# Patient Record
Sex: Female | Born: 1965
Health system: Southern US, Community
[De-identification: ages and names within clinical notes are randomized; demographics above are authoritative.]

## PROBLEM LIST (undated history)

## (undated) DIAGNOSIS — F32A Depression, unspecified: Secondary | ICD-10-CM

## (undated) DIAGNOSIS — E039 Hypothyroidism, unspecified: Secondary | ICD-10-CM

## (undated) DIAGNOSIS — E079 Disorder of thyroid, unspecified: Secondary | ICD-10-CM

## (undated) DIAGNOSIS — I714 Abdominal aortic aneurysm, without rupture, unspecified: Secondary | ICD-10-CM

## (undated) DIAGNOSIS — G4733 Obstructive sleep apnea (adult) (pediatric): Secondary | ICD-10-CM

## (undated) DIAGNOSIS — R351 Nocturia: Secondary | ICD-10-CM

## (undated) DIAGNOSIS — R059 Cough, unspecified: Secondary | ICD-10-CM

## (undated) DIAGNOSIS — I10 Essential (primary) hypertension: Secondary | ICD-10-CM

## (undated) DIAGNOSIS — R05 Cough: Secondary | ICD-10-CM

## (undated) DIAGNOSIS — N200 Calculus of kidney: Secondary | ICD-10-CM

## (undated) DIAGNOSIS — G4730 Sleep apnea, unspecified: Secondary | ICD-10-CM

## (undated) DIAGNOSIS — F17218 Nicotine dependence, cigarettes, with other nicotine-induced disorders: Secondary | ICD-10-CM

## (undated) DIAGNOSIS — J189 Pneumonia, unspecified organism: Secondary | ICD-10-CM

## (undated) DIAGNOSIS — J353 Hypertrophy of tonsils with hypertrophy of adenoids: Secondary | ICD-10-CM

## (undated) DIAGNOSIS — F419 Anxiety disorder, unspecified: Secondary | ICD-10-CM

## (undated) DIAGNOSIS — Z9989 Dependence on other enabling machines and devices: Principal | ICD-10-CM

## (undated) DIAGNOSIS — F329 Major depressive disorder, single episode, unspecified: Secondary | ICD-10-CM

## (undated) DIAGNOSIS — C569 Malignant neoplasm of unspecified ovary: Secondary | ICD-10-CM

## (undated) HISTORY — DX: Dependence on other enabling machines and devices: Z99.89

## (undated) HISTORY — DX: Hypothyroidism, unspecified: E03.9

## (undated) HISTORY — PX: CHOLECYSTECTOMY: SHX55

## (undated) HISTORY — DX: Nocturia: R35.1

## (undated) HISTORY — DX: Nicotine dependence, cigarettes, with other nicotine-induced disorders: F17.218

## (undated) HISTORY — DX: Depression, unspecified: F32.A

## (undated) HISTORY — DX: Obstructive sleep apnea (adult) (pediatric): G47.33

## (undated) HISTORY — DX: Hypertrophy of tonsils with hypertrophy of adenoids: J35.3

## (undated) HISTORY — PX: TUBAL LIGATION: SHX77

## (undated) HISTORY — DX: Anxiety disorder, unspecified: F41.9

## (undated) HISTORY — DX: Major depressive disorder, single episode, unspecified: F32.9

## (undated) HISTORY — DX: Abdominal aortic aneurysm, without rupture, unspecified: I71.40

## (undated) HISTORY — DX: Abdominal aortic aneurysm, without rupture: I71.4

## (undated) HISTORY — DX: Sleep apnea, unspecified: G47.30

---

## 1998-06-19 ENCOUNTER — Inpatient Hospital Stay (HOSPITAL_COMMUNITY): Admission: AD | Admit: 1998-06-19 | Discharge: 1998-07-05 | Payer: Self-pay | Admitting: Internal Medicine

## 1998-06-20 ENCOUNTER — Encounter: Payer: Self-pay | Admitting: Pulmonary Disease

## 1998-06-21 ENCOUNTER — Encounter: Payer: Self-pay | Admitting: Pulmonary Disease

## 1998-06-22 ENCOUNTER — Encounter: Payer: Self-pay | Admitting: Pulmonary Disease

## 1998-06-23 ENCOUNTER — Encounter: Payer: Self-pay | Admitting: Pulmonary Disease

## 1998-06-27 ENCOUNTER — Encounter: Payer: Self-pay | Admitting: Critical Care Medicine

## 1998-06-28 ENCOUNTER — Encounter: Payer: Self-pay | Admitting: Critical Care Medicine

## 1998-06-29 ENCOUNTER — Encounter: Payer: Self-pay | Admitting: Critical Care Medicine

## 2000-11-06 ENCOUNTER — Emergency Department (HOSPITAL_COMMUNITY): Admission: EM | Admit: 2000-11-06 | Discharge: 2000-11-06 | Payer: Self-pay | Admitting: *Deleted

## 2000-11-06 ENCOUNTER — Encounter: Payer: Self-pay | Admitting: *Deleted

## 2001-01-14 HISTORY — PX: ABDOMINAL HYSTERECTOMY: SHX81

## 2001-07-07 ENCOUNTER — Inpatient Hospital Stay (HOSPITAL_COMMUNITY): Admission: RE | Admit: 2001-07-07 | Discharge: 2001-07-09 | Payer: Self-pay | Admitting: *Deleted

## 2001-07-15 ENCOUNTER — Encounter: Payer: Self-pay | Admitting: *Deleted

## 2001-07-15 ENCOUNTER — Ambulatory Visit (HOSPITAL_COMMUNITY): Admission: RE | Admit: 2001-07-15 | Discharge: 2001-07-15 | Payer: Self-pay | Admitting: *Deleted

## 2001-08-24 ENCOUNTER — Ambulatory Visit (HOSPITAL_COMMUNITY): Admission: RE | Admit: 2001-08-24 | Discharge: 2001-08-24 | Payer: Self-pay | Admitting: *Deleted

## 2001-09-18 ENCOUNTER — Encounter (HOSPITAL_COMMUNITY): Admission: RE | Admit: 2001-09-18 | Discharge: 2001-10-18 | Payer: Self-pay | Admitting: Oncology

## 2001-09-18 ENCOUNTER — Encounter: Admission: RE | Admit: 2001-09-18 | Discharge: 2001-09-18 | Payer: Self-pay | Admitting: Oncology

## 2001-09-23 ENCOUNTER — Ambulatory Visit (HOSPITAL_COMMUNITY): Admission: RE | Admit: 2001-09-23 | Discharge: 2001-09-23 | Payer: Self-pay | Admitting: General Surgery

## 2001-09-23 ENCOUNTER — Other Ambulatory Visit: Admission: RE | Admit: 2001-09-23 | Discharge: 2001-09-23 | Payer: Self-pay | Admitting: *Deleted

## 2001-09-23 ENCOUNTER — Encounter: Payer: Self-pay | Admitting: General Surgery

## 2001-10-19 ENCOUNTER — Encounter: Admission: RE | Admit: 2001-10-19 | Discharge: 2001-10-19 | Payer: Self-pay | Admitting: Oncology

## 2001-10-19 ENCOUNTER — Encounter (HOSPITAL_COMMUNITY): Admission: RE | Admit: 2001-10-19 | Discharge: 2001-11-18 | Payer: Self-pay | Admitting: Oncology

## 2001-11-30 ENCOUNTER — Encounter (HOSPITAL_COMMUNITY): Admission: RE | Admit: 2001-11-30 | Discharge: 2001-12-30 | Payer: Self-pay | Admitting: Oncology

## 2001-11-30 ENCOUNTER — Encounter: Admission: RE | Admit: 2001-11-30 | Discharge: 2001-11-30 | Payer: Self-pay | Admitting: Oncology

## 2001-12-25 ENCOUNTER — Encounter: Payer: Self-pay | Admitting: Internal Medicine

## 2001-12-25 ENCOUNTER — Emergency Department (HOSPITAL_COMMUNITY): Admission: EM | Admit: 2001-12-25 | Discharge: 2001-12-25 | Payer: Self-pay | Admitting: Internal Medicine

## 2002-01-11 ENCOUNTER — Encounter (HOSPITAL_COMMUNITY): Admission: RE | Admit: 2002-01-11 | Discharge: 2002-02-10 | Payer: Self-pay | Admitting: Oncology

## 2002-01-11 ENCOUNTER — Encounter: Admission: RE | Admit: 2002-01-11 | Discharge: 2002-01-11 | Payer: Self-pay | Admitting: Oncology

## 2002-01-28 ENCOUNTER — Encounter (HOSPITAL_COMMUNITY): Payer: Self-pay | Admitting: Oncology

## 2002-02-03 ENCOUNTER — Ambulatory Visit (HOSPITAL_COMMUNITY): Admission: RE | Admit: 2002-02-03 | Discharge: 2002-02-03 | Payer: Self-pay | Admitting: General Surgery

## 2002-05-03 ENCOUNTER — Encounter (HOSPITAL_COMMUNITY): Admission: RE | Admit: 2002-05-03 | Discharge: 2002-06-02 | Payer: Self-pay | Admitting: Oncology

## 2002-05-03 ENCOUNTER — Encounter: Admission: RE | Admit: 2002-05-03 | Discharge: 2002-05-03 | Payer: Self-pay

## 2002-05-26 ENCOUNTER — Inpatient Hospital Stay (HOSPITAL_COMMUNITY): Admission: EM | Admit: 2002-05-26 | Discharge: 2002-05-28 | Payer: Self-pay | Admitting: Internal Medicine

## 2002-05-26 ENCOUNTER — Encounter: Payer: Self-pay | Admitting: Internal Medicine

## 2002-05-27 ENCOUNTER — Encounter: Payer: Self-pay | Admitting: Urology

## 2002-08-02 ENCOUNTER — Encounter (HOSPITAL_COMMUNITY): Admission: RE | Admit: 2002-08-02 | Discharge: 2002-09-01 | Payer: Self-pay | Admitting: Oncology

## 2002-08-02 ENCOUNTER — Encounter: Admission: RE | Admit: 2002-08-02 | Discharge: 2002-08-02 | Payer: Self-pay | Admitting: Oncology

## 2002-11-02 ENCOUNTER — Encounter: Admission: RE | Admit: 2002-11-02 | Discharge: 2002-11-02 | Payer: Self-pay | Admitting: Oncology

## 2002-11-02 ENCOUNTER — Encounter (HOSPITAL_COMMUNITY): Admission: RE | Admit: 2002-11-02 | Discharge: 2002-12-02 | Payer: Self-pay | Admitting: Oncology

## 2002-11-27 ENCOUNTER — Emergency Department (HOSPITAL_COMMUNITY): Admission: EM | Admit: 2002-11-27 | Discharge: 2002-11-28 | Payer: Self-pay

## 2003-02-02 ENCOUNTER — Encounter: Admission: RE | Admit: 2003-02-02 | Discharge: 2003-02-02 | Payer: Self-pay | Admitting: Oncology

## 2003-02-02 ENCOUNTER — Encounter (HOSPITAL_COMMUNITY): Admission: RE | Admit: 2003-02-02 | Discharge: 2003-03-04 | Payer: Self-pay | Admitting: Oncology

## 2005-01-14 HISTORY — PX: HEMORRHOID SURGERY: SHX153

## 2006-03-21 ENCOUNTER — Observation Stay (HOSPITAL_COMMUNITY): Admission: RE | Admit: 2006-03-21 | Discharge: 2006-03-22 | Payer: Self-pay | Admitting: General Surgery

## 2010-02-27 LAB — HM MAMMOGRAPHY: HM Mammogram: NORMAL

## 2010-07-30 ENCOUNTER — Emergency Department (HOSPITAL_COMMUNITY)
Admission: EM | Admit: 2010-07-30 | Discharge: 2010-07-30 | Discharge status: Against Medical Advice | Payer: PRIVATE HEALTH INSURANCE | Attending: Emergency Medicine | Admitting: Emergency Medicine

## 2010-07-30 ENCOUNTER — Encounter: Payer: Self-pay | Admitting: Emergency Medicine

## 2010-07-30 DIAGNOSIS — R350 Frequency of micturition: Secondary | ICD-10-CM | POA: Insufficient documentation

## 2010-07-30 DIAGNOSIS — R509 Fever, unspecified: Secondary | ICD-10-CM | POA: Insufficient documentation

## 2010-07-30 DIAGNOSIS — R52 Pain, unspecified: Secondary | ICD-10-CM | POA: Insufficient documentation

## 2010-07-30 HISTORY — DX: Pneumonia, unspecified organism: J18.9

## 2010-07-30 HISTORY — DX: Essential (primary) hypertension: I10

## 2010-07-30 HISTORY — DX: Malignant neoplasm of unspecified ovary: C56.9

## 2010-07-30 HISTORY — DX: Disorder of thyroid, unspecified: E07.9

## 2010-07-30 NOTE — ED Notes (Signed)
Pt states symptoms began at 1400. Fever, chills, body aches, and urinary frequency.

## 2012-08-31 ENCOUNTER — Other Ambulatory Visit: Payer: Self-pay | Admitting: Family Medicine

## 2012-08-31 ENCOUNTER — Encounter: Payer: Self-pay | Admitting: Family Medicine

## 2012-08-31 NOTE — Telephone Encounter (Signed)
This encounter was created in error - please disregard.

## 2012-08-31 NOTE — Telephone Encounter (Signed)
Levothyroxine 112 mcg tab 1 QD #30

## 2012-10-05 ENCOUNTER — Telehealth: Payer: Self-pay | Admitting: Family Medicine

## 2012-10-05 ENCOUNTER — Other Ambulatory Visit: Payer: Self-pay | Admitting: Family Medicine

## 2012-10-05 ENCOUNTER — Encounter: Payer: Self-pay | Admitting: Family Medicine

## 2012-10-05 NOTE — Telephone Encounter (Signed)
Celexa 40 mg 1 QD #30

## 2012-10-05 NOTE — Telephone Encounter (Signed)
Medication refill for one time only.  Patient needs to be seen.  Letter sent for patient to call and schedule 

## 2012-10-06 MED ORDER — CITALOPRAM HYDROBROMIDE 40 MG PO TABS: ORAL_TABLET | ORAL | Status: DC

## 2012-11-03 ENCOUNTER — Ambulatory Visit (INDEPENDENT_AMBULATORY_CARE_PROVIDER_SITE_OTHER): Payer: PRIVATE HEALTH INSURANCE | Admitting: Family Medicine

## 2012-11-03 ENCOUNTER — Encounter: Payer: Self-pay | Admitting: Family Medicine

## 2012-11-03 VITALS — BP 126/76 | HR 64 | Temp 98.5°F | Resp 18 | Ht 64.0 in | Wt 232.0 lb

## 2012-11-03 DIAGNOSIS — F411 Generalized anxiety disorder: Secondary | ICD-10-CM

## 2012-11-03 DIAGNOSIS — Z23 Encounter for immunization: Secondary | ICD-10-CM

## 2012-11-03 DIAGNOSIS — E039 Hypothyroidism, unspecified: Secondary | ICD-10-CM

## 2012-11-03 LAB — CBC WITH DIFFERENTIAL/PLATELET
Basophils Absolute: 0 10*3/uL (ref 0.0–0.1)
Eosinophils Absolute: 0.2 10*3/uL (ref 0.0–0.7)
Eosinophils Relative: 2 % (ref 0–5)
Hemoglobin: 13.1 g/dL (ref 12.0–15.0)
Lymphocytes Relative: 55 % — ABNORMAL HIGH (ref 12–46)
Lymphs Abs: 3.5 10*3/uL (ref 0.7–4.0)
MCH: 29.5 pg (ref 26.0–34.0)
MCV: 87.2 fL (ref 78.0–100.0)
Monocytes Relative: 7 % (ref 3–12)
Platelets: 257 10*3/uL (ref 150–400)
RBC: 4.44 MIL/uL (ref 3.87–5.11)
RDW: 15.4 % (ref 11.5–15.5)
WBC: 6.4 10*3/uL (ref 4.0–10.5)

## 2012-11-03 MED ORDER — ALPRAZOLAM 0.5 MG PO TABS: 0.5000 mg | ORAL_TABLET | Freq: Three times a day (TID) | ORAL | Status: DC | PRN

## 2012-11-03 MED ORDER — LEVOTHYROXINE SODIUM 112 MCG PO TABS: 112.0000 ug | ORAL_TABLET | Freq: Every day | ORAL | Status: DC

## 2012-11-03 MED ORDER — CITALOPRAM HYDROBROMIDE 40 MG PO TABS: ORAL_TABLET | ORAL | Status: DC

## 2012-11-03 NOTE — Progress Notes (Signed)
Subjective:    Patient ID: [name] [name], female    DOB: Jul 09, 1965, 47 y.o.   MRN: [id number]  HPI Patient 47 year old white female with a history of hypothyroidism. She's currently taking levothyroxine 112 mcg by mouth daily. She is due to recheck a TSH. She is trying to exercise more. She is riding a bicycle at home. She is switched to diet sodas. She is trying to watch her caloric intake. She continues to smoke. She reports worsening anxiety. Celexa 40 mg by mouth daily seems to help her depression very well. Unfortunately she is having breakthrough anxiety attacks as well as crying spells 2 to stress she is under. 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 The mounting family stress is beginning to cause the patient more emotional problems. She would like to have a medication she could use sparingly as needed for anxiety. Past Medical History  Diagnosis Date  . Thyroid disease   . Hypertension   . Pneumonia   . Ovarian cancer   . Anxiety   . Depression    No current outpatient prescriptions on file prior to visit.   No current facility-administered medications on file prior to visit.   Allergies  Allergen Reactions  . Other     IV CONTRAST   History   Social History  . Marital Status: Married    Spouse Name: N/A    Number of Children: N/A  . Years of Education: N/A   Occupational History  . Not on file.   Social History Main Topics  . Smoking status: Current Every Day Smoker -- 1.00 packs/day    Types: Cigarettes  . Smokeless tobacco: Never Used  . Alcohol Use: No  . Drug Use: No  . Sexual Activity: Not on file   Other Topics Concern  . Not on file   Social History Narrative  . No narrative on file      Review of Systems  All other systems reviewed and are negative.       Objective:   Physical Exam  Vitals reviewed. Neck: Neck supple. No JVD present. No thyromegaly present.  Cardiovascular: Normal rate, regular rhythm and normal heart  sounds.  Exam reveals no gallop.   No murmur heard. Pulmonary/Chest: Effort normal and breath sounds normal. No respiratory distress. She has no wheezes. She has no rales. She exhibits no tenderness.  Abdominal: Soft. Bowel sounds are normal. She exhibits no distension. There is no tenderness. There is no rebound and no guarding.  Lymphadenopathy:    She has no cervical adenopathy.  Psychiatric: She has a normal mood and affect. Her behavior is normal. Judgment and thought content normal.          Assessment & Plan:  Unspecified hypothyroidism - Plan: TSH, CBC with Differential, COMPLETE METABOLIC PANEL WITH GFR  GAD (generalized anxiety disorder) - Plan: ALPRAZolam (XANAX) 0.5 MG tablet  Need for pneumococcal vaccine - Plan: Pneumococcal polysaccharide vaccine 23-valent greater than or equal to 2yo subcutaneous/IM  I recommended the patient discontinue smoking. I did give her a pneumonia vaccine. She has had a flu shot. Check a CBC a CMP and TSH to evaluate her hyperthyroidism. Her blood pressures well controlled. I recommended increasing aerobic exercise and decreasing daily caloric intake to help facilitate further weight loss.  Continue Celexa 40 mg by mouth daily. Add Xanax 0.5 mg Q8 hours as needed for anxiety. I gave the patient 60 tablets with 0 refills. I instructed her to use this  sparingly. If symptoms worsen I would switch the patient to Effexor from the Celexa.

## 2012-11-04 LAB — COMPLETE METABOLIC PANEL WITH GFR
ALT: 16 U/L (ref 0–35)
CO2: 26 mEq/L (ref 19–32)
Calcium: 9.5 mg/dL (ref 8.4–10.5)
Chloride: 105 mEq/L (ref 96–112)
Creat: 0.64 mg/dL (ref 0.50–1.10)
GFR, Est African American: >89 mL/min
GFR, Est Non African American: >89 mL/min
Glucose, Bld: 76 mg/dL (ref 70–99)
Sodium: 137 mEq/L (ref 135–145)
Total Protein: 7.3 g/dL (ref 6.0–8.3)

## 2012-11-05 ENCOUNTER — Encounter: Payer: Self-pay | Admitting: Family Medicine

## 2012-11-24 ENCOUNTER — Encounter (HOSPITAL_COMMUNITY): Payer: Self-pay | Admitting: Emergency Medicine

## 2012-11-24 ENCOUNTER — Emergency Department (HOSPITAL_COMMUNITY): Payer: PRIVATE HEALTH INSURANCE

## 2012-11-24 ENCOUNTER — Emergency Department (HOSPITAL_COMMUNITY)
Admission: EM | Admit: 2012-11-24 | Discharge: 2012-11-24 | Disposition: A | Discharge status: Home / Self Care | Payer: PRIVATE HEALTH INSURANCE | Attending: Emergency Medicine | Admitting: Emergency Medicine

## 2012-11-24 DIAGNOSIS — F329 Major depressive disorder, single episode, unspecified: Secondary | ICD-10-CM | POA: Insufficient documentation

## 2012-11-24 DIAGNOSIS — M542 Cervicalgia: Secondary | ICD-10-CM | POA: Insufficient documentation

## 2012-11-24 DIAGNOSIS — R42 Dizziness and giddiness: Secondary | ICD-10-CM | POA: Insufficient documentation

## 2012-11-24 DIAGNOSIS — E079 Disorder of thyroid, unspecified: Secondary | ICD-10-CM | POA: Insufficient documentation

## 2012-11-24 DIAGNOSIS — Z8543 Personal history of malignant neoplasm of ovary: Secondary | ICD-10-CM | POA: Insufficient documentation

## 2012-11-24 DIAGNOSIS — F411 Generalized anxiety disorder: Secondary | ICD-10-CM | POA: Insufficient documentation

## 2012-11-24 DIAGNOSIS — Z87442 Personal history of urinary calculi: Secondary | ICD-10-CM | POA: Insufficient documentation

## 2012-11-24 DIAGNOSIS — Z79899 Other long term (current) drug therapy: Secondary | ICD-10-CM | POA: Insufficient documentation

## 2012-11-24 DIAGNOSIS — M62838 Other muscle spasm: Secondary | ICD-10-CM

## 2012-11-24 DIAGNOSIS — F3289 Other specified depressive episodes: Secondary | ICD-10-CM | POA: Insufficient documentation

## 2012-11-24 DIAGNOSIS — I10 Essential (primary) hypertension: Secondary | ICD-10-CM | POA: Insufficient documentation

## 2012-11-24 DIAGNOSIS — Z8701 Personal history of pneumonia (recurrent): Secondary | ICD-10-CM | POA: Insufficient documentation

## 2012-11-24 DIAGNOSIS — F17200 Nicotine dependence, unspecified, uncomplicated: Secondary | ICD-10-CM | POA: Insufficient documentation

## 2012-11-24 HISTORY — DX: Calculus of kidney: N20.0

## 2012-11-24 LAB — URINALYSIS, ROUTINE W REFLEX MICROSCOPIC
Bilirubin Urine: NEGATIVE
Ketones, ur: NEGATIVE mg/dL
Leukocytes, UA: NEGATIVE
Nitrite: NEGATIVE
Protein, ur: NEGATIVE mg/dL
Specific Gravity, Urine: 1.025 (ref 1.005–1.030)
Urobilinogen, UA: 0.2 mg/dL (ref 0.0–1.0)
pH: 6 (ref 5.0–8.0)

## 2012-11-24 LAB — POCT I-STAT, CHEM 8
Calcium, Ion: 1.31 mmol/L — ABNORMAL HIGH (ref 1.12–1.23)
Creatinine, Ser: 0.8 mg/dL (ref 0.50–1.10)
Hemoglobin: 12.9 g/dL (ref 12.0–15.0)
Sodium: 144 mEq/L (ref 135–145)
TCO2: 25 mmol/L (ref 0–100)

## 2012-11-24 LAB — POCT I-STAT TROPONIN I

## 2012-11-24 MED ORDER — METHOCARBAMOL 500 MG PO TABS: 1000.0000 mg | ORAL_TABLET | Freq: Four times a day (QID) | ORAL | Status: DC | PRN

## 2012-11-24 MED ORDER — NAPROXEN 250 MG PO TABS: 250.0000 mg | ORAL_TABLET | Freq: Two times a day (BID) | ORAL | Status: DC

## 2012-11-24 MED ORDER — HYDROCODONE-ACETAMINOPHEN 5-325 MG PO TABS
1.0000 | ORAL_TABLET | Freq: Once | ORAL | Status: AC
  Administered 2012-11-24: 1 via ORAL
  Filled 2012-11-24: qty 1

## 2012-11-24 MED ORDER — HYDROCODONE-ACETAMINOPHEN 5-325 MG PO TABS: ORAL_TABLET | ORAL | Status: DC

## 2012-11-24 MED ORDER — DIAZEPAM 5 MG PO TABS
5.0000 mg | ORAL_TABLET | Freq: Once | ORAL | Status: AC
  Administered 2012-11-24: 5 mg via ORAL
  Filled 2012-11-24: qty 1

## 2012-11-24 NOTE — ED Notes (Signed)
Patient complaining of left shoulder pain radiating into neck. Reports has felt lightheaded since yesterday.

## 2012-11-24 NOTE — ED Provider Notes (Signed)
I assumed care at signout Pt feels improved She is ambulatory EKG/labs reviewed Stable for d/c home  Joya Gaskins, MD 11/24/12 2332

## 2012-11-24 NOTE — ED Notes (Signed)
Pt presents with left neck and shoulder pain that she awoke with this morning.  Pt also c/o headache x 2 weeks. Spouse reports pt has stopped drinking caffeine 2 weeks ago abruptly. Small knot felt along her collar bone on left side.  Reports unable to lift left arm above her head. Pt denies injury/trauma, and no swelling or deformity noted. Radial pulses equal at this time.

## 2012-11-24 NOTE — ED Provider Notes (Signed)
CSN: [id number]     Arrival date & time 11/24/12  1834 History   First MD Initiated Contact with Patient 11/24/12 2021     Chief Complaint  Patient presents with  . Shoulder Pain  . Dizziness    HPI Pt was seen at 2025. Per pt, c/o gradual onset and persistence of constant left sided neck/shoulder pain for the past 2 days. Pt describes the pain as "sharp," worsens when she moves her left shoulder into abduction. 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 Pt also states she has felt "lightheaded" since yesterday. Her lightheadedness occurs when she moves from bending over to standing positions. Denies CP/palpitations, no SOB/cough, no abd pain, no N/V/D, no injury, no headache, no visual changes, no focal motor weakness, no tingling/numbness in extremities, no fevers, no rash.        Past Medical History  Diagnosis Date  . Thyroid disease   . Hypertension   . Pneumonia   . Ovarian cancer   . Anxiety   . Depression   . Kidney stone    Past Surgical History  Procedure Laterality Date  . Abdominal hysterectomy    . Tubal ligation    . Hemorrhoid surgery      History  Substance Use Topics  . Smoking status: Current Every Day Smoker -- 1.00 packs/day    Types: Cigarettes  . Smokeless tobacco: Never Used  . Alcohol Use: No    Review of Systems ROS: Statement: All systems negative except as marked or noted in the HPI; Constitutional: Negative for fever and chills. ; ; Eyes: Negative for eye pain, redness and discharge. ; ; ENMT: Negative for ear pain, hoarseness, nasal congestion, sinus pressure and sore throat. ; ; Cardiovascular: Negative for chest pain, palpitations, diaphoresis, dyspnea and peripheral edema. ; ; Respiratory: Negative for cough, wheezing and stridor. ; ; Gastrointestinal: Negative for nausea, vomiting, diarrhea, abdominal pain, blood in stool, hematemesis, jaundice and rectal bleeding. . ; ; Genitourinary: Negative for dysuria, flank pain and hematuria. ; ;  Musculoskeletal: +left neck/shoulder pain. Negative for back pain. Negative for swelling and trauma.; ; Skin: Negative for pruritus, rash, abrasions, blisters, bruising and skin lesion.; ; Neuro: +lightheadedness. Negative for headache and neck stiffness. Negative for weakness, altered level of consciousness , altered mental status, extremity weakness, paresthesias, involuntary movement, seizure and syncope.     Allergies  Other  Home Medications   Current Outpatient Rx  Name  Route  Sig  Dispense  Refill  . ALPRAZolam (XANAX) 0.5 MG tablet   Oral   Take 1 tablet (0.5 mg total) by mouth 3 (three) times daily as needed for sleep or anxiety.   60 tablet   0   . citalopram (CELEXA) 40 MG tablet   Oral   Take 40 mg by mouth daily.         Marland Kitchen levothyroxine (SYNTHROID, LEVOTHROID) 112 MCG tablet   Oral   Take 1 tablet (112 mcg total) by mouth daily before breakfast.   30 tablet   3   . Multiple Vitamin (MULTIVITAMIN) tablet   Oral   Take 1 tablet by mouth daily.         . Omega-3 Fatty Acids (FISH OIL) 1000 MG CAPS   Oral   Take 2 capsules by mouth daily.          BP 126/75  Pulse 87  Temp(Src) 98.4 F (36.9 C) (Oral)  Resp 20  Ht 5\' 3"  (1.6 m)  Wt 224 lb (101.606 kg)  BMI 39.69 kg/m2  SpO2 98% Physical Exam 2030: Physical examination:  Nursing notes reviewed; Vital signs and O2 SAT reviewed;  Constitutional: Well developed, Well nourished, Well hydrated, In no acute distress; Head:  Normocephalic, atraumatic; Eyes: EOMI, PERRL, No scleral icterus; ENMT: Mouth and pharynx normal, Mucous membranes moist; Neck: Supple, no meningeal signs. Full range of motion, No lymphadenopathy; Cardiovascular: Regular rate and rhythm, No gallop; Respiratory: Breath sounds clear & equal bilaterally, No wheezes.  Speaking full sentences with ease, Normal respiratory effort/excursion; Chest: Nontender, Movement normal; Abdomen: Soft, Nontender, Nondistended, Normal bowel sounds;  Genitourinary: No CVA tenderness; Spine:  No midline CS, TS, LS tenderness. +TTP left hypertonic trapezius muscle which reproduces pt's pain.;; Extremities: Pulses normal, NT left shoulder/elbow/wrist. NMS intact LUE with strong radial pulse. No rash, no deformity. No edema, No calf edema or asymmetry.; Neuro: AA&Ox3, Major CN grossly intact. No facial droop. Speech clear. Climbs on and off stretcher easily by herself. Gait steady. No gross focal motor or sensory deficits in extremities.; Skin: Color normal, Warm, Dry.   ED Course  Procedures  EKG Interpretation     Ventricular Rate:  77 PR Interval:  172 QRS Duration: 108 QT Interval:  396 QTC Calculation: 448 R Axis:   112 Text Interpretation:  Normal sinus rhythm Low voltage QRS Incomplete right bundle branch block Left posterior fascicular block No previous ECGs available            MDM  MDM Reviewed: previous chart, nursing note and vitals Reviewed previous: labs Interpretation: labs, ECG and x-ray   Results for orders placed during the hospital encounter of 11/24/12  POCT I-STAT TROPONIN I      Result Value Range   Troponin i, poc 0.00  0.00 - 0.08 ng/mL   Comment 3           POCT I-STAT, CHEM 8      Result Value Range   Sodium 144  135 - 145 mEq/L   Potassium 4.0  3.5 - 5.1 mEq/L   Chloride 106  96 - 112 mEq/L   BUN 23  6 - 23 mg/dL   Creatinine, Ser 4.09  0.50 - 1.10 mg/dL   Glucose, Bld 811 (*) 70 - 99 mg/dL   Calcium, Ion 9.14 (*) 1.12 - 1.23 mmol/L   TCO2 25  0 - 100 mmol/L   Hemoglobin 12.9  12.0 - 15.0 g/dL   HCT 78.2  95.6 - 21.3 %   Dg Chest 2 View 11/24/2012   CLINICAL DATA:  Shoulder pain and dizziness.  EXAM: CHEST  2 VIEW  COMPARISON:  None.  FINDINGS: Lungs are adequately inflated without focal consolidation or effusion. There is mild cardiomegaly. There are prominent overlying soft tissues. There is minimal spondylosis of the spine.  IMPRESSION: No acute cardiopulmonary disease.  Mild  cardiomegaly.   Electronically Signed   By: Elberta Fortis M.D.   On: 11/24/2012 21:38    2245:  Pt is not orthostatic. Neuro exam intact and unchanged. Pt is awaiting pain meds and to give a urine sample. Sign out to Dr. Bebe Shaggy.     Laray Anger, DO 11/24/12 [phone]

## 2012-11-24 NOTE — ED Notes (Signed)
MD at bedside. Dr Alwyn Pea at bedside, discussing plan of care and examining said pt.

## 2013-03-23 ENCOUNTER — Encounter (HOSPITAL_COMMUNITY): Payer: Self-pay | Admitting: Emergency Medicine

## 2013-03-23 ENCOUNTER — Emergency Department (HOSPITAL_COMMUNITY)
Admission: EM | Admit: 2013-03-23 | Discharge: 2013-03-24 | Disposition: A | Discharge status: Home / Self Care | Payer: PRIVATE HEALTH INSURANCE | Attending: Emergency Medicine | Admitting: Emergency Medicine

## 2013-03-23 ENCOUNTER — Emergency Department (HOSPITAL_COMMUNITY): Payer: PRIVATE HEALTH INSURANCE

## 2013-03-23 DIAGNOSIS — Z8543 Personal history of malignant neoplasm of ovary: Secondary | ICD-10-CM | POA: Insufficient documentation

## 2013-03-23 DIAGNOSIS — F329 Major depressive disorder, single episode, unspecified: Secondary | ICD-10-CM | POA: Insufficient documentation

## 2013-03-23 DIAGNOSIS — F411 Generalized anxiety disorder: Secondary | ICD-10-CM | POA: Insufficient documentation

## 2013-03-23 DIAGNOSIS — X58XXXA Exposure to other specified factors, initial encounter: Secondary | ICD-10-CM | POA: Insufficient documentation

## 2013-03-23 DIAGNOSIS — Z8701 Personal history of pneumonia (recurrent): Secondary | ICD-10-CM | POA: Insufficient documentation

## 2013-03-23 DIAGNOSIS — Z87442 Personal history of urinary calculi: Secondary | ICD-10-CM | POA: Insufficient documentation

## 2013-03-23 DIAGNOSIS — IMO0002 Reserved for concepts with insufficient information to code with codable children: Secondary | ICD-10-CM | POA: Insufficient documentation

## 2013-03-23 DIAGNOSIS — Y939 Activity, unspecified: Secondary | ICD-10-CM | POA: Insufficient documentation

## 2013-03-23 DIAGNOSIS — R509 Fever, unspecified: Secondary | ICD-10-CM

## 2013-03-23 DIAGNOSIS — F17200 Nicotine dependence, unspecified, uncomplicated: Secondary | ICD-10-CM | POA: Insufficient documentation

## 2013-03-23 DIAGNOSIS — Y929 Unspecified place or not applicable: Secondary | ICD-10-CM | POA: Insufficient documentation

## 2013-03-23 DIAGNOSIS — F3289 Other specified depressive episodes: Secondary | ICD-10-CM | POA: Insufficient documentation

## 2013-03-23 DIAGNOSIS — E079 Disorder of thyroid, unspecified: Secondary | ICD-10-CM | POA: Insufficient documentation

## 2013-03-23 DIAGNOSIS — R112 Nausea with vomiting, unspecified: Secondary | ICD-10-CM

## 2013-03-23 DIAGNOSIS — R197 Diarrhea, unspecified: Secondary | ICD-10-CM | POA: Insufficient documentation

## 2013-03-23 DIAGNOSIS — I10 Essential (primary) hypertension: Secondary | ICD-10-CM | POA: Insufficient documentation

## 2013-03-23 DIAGNOSIS — Z79899 Other long term (current) drug therapy: Secondary | ICD-10-CM | POA: Insufficient documentation

## 2013-03-23 LAB — CBC WITH DIFFERENTIAL/PLATELET
Basophils Absolute: 0 10*3/uL (ref 0.0–0.1)
Basophils Relative: 0 % (ref 0–1)
EOS PCT: 0 % (ref 0–5)
Eosinophils Absolute: 0 10*3/uL (ref 0.0–0.7)
HEMATOCRIT: 39.9 % (ref 36.0–46.0)
HEMOGLOBIN: 13.4 g/dL (ref 12.0–15.0)
LYMPHS PCT: 10 % — AB (ref 12–46)
Lymphs Abs: 0.7 10*3/uL (ref 0.7–4.0)
MCH: 30 pg (ref 26.0–34.0)
MCHC: 33.6 g/dL (ref 30.0–36.0)
MCV: 89.3 fL (ref 78.0–100.0)
MONO ABS: 0.2 10*3/uL (ref 0.1–1.0)
MONOS PCT: 3 % (ref 3–12)
Neutro Abs: 6 10*3/uL (ref 1.7–7.7)
Neutrophils Relative %: 87 % — ABNORMAL HIGH (ref 43–77)
Platelets: 208 10*3/uL (ref 150–400)
RBC: 4.47 MIL/uL (ref 3.87–5.11)
RDW: 15.4 % (ref 11.5–15.5)
WBC: 6.9 10*3/uL (ref 4.0–10.5)

## 2013-03-23 MED ORDER — IBUPROFEN 800 MG PO TABS
800.0000 mg | ORAL_TABLET | Freq: Once | ORAL | Status: AC
  Administered 2013-03-23: 800 mg via ORAL
  Filled 2013-03-23: qty 1

## 2013-03-23 MED ORDER — SODIUM CHLORIDE 0.9 % IV SOLN: 1000.0000 mL | INTRAVENOUS | Status: DC

## 2013-03-23 MED ORDER — SODIUM CHLORIDE 0.9 % IV SOLN
1000.0000 mL | Freq: Once | INTRAVENOUS | Status: AC
  Administered 2013-03-23: 1000 mL via INTRAVENOUS

## 2013-03-23 NOTE — ED Notes (Signed)
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 Pt received 250cc of fluid and zofran 4mg  enroute to facility.

## 2013-03-23 NOTE — ED Provider Notes (Signed)
CSN: [id number]     Arrival date & time 03/23/13  2215 History  This chart was scribed for Hoy Morn, MD by Jenne Campus, ED Scribe. This patient was seen in room APA10/APA10 and the patient's care was started at 11:13 PM.   Chief Complaint  Patient presents with  . Loss of Consciousness     The history is provided by the patient. No language interpreter was used.    HPI Comments: [name] [name] is a 48 y.o. female who presents to the Emergency Department via EMS from home with husband and family friend for one episode of syncope that occurred about one hour ago.  Pt states that she has had sinus congestion, mild cough and a HA for the past week. She then developed nausea and vomiting after eating today. Pt had been laying in bed with her CPAP machine when she developed chills. She had taken it off and went to get out of bed when "everything went white".  Husband states that she got up out of bed and a few seconds later he heard a loud bang. He found her face down on the floor with blood coming out of her nose. The episode lasted for 30 seconds after which he called EMS. He states that while waiting for EMS he cleaned the patient up of a loose BM. He denies noting any jerking movements. He denies any prior episodes of syncope to his knowledge. Pt denies any nausea currently. She denies any abdominal pain, diarrhea, neck or back pain, SOB and urinary symptoms. She denies any changes in use of her extremities. She takes one [facility] power everyday but denies being on any anticoagulants.   Works at [employer]. 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 Thyroid disease   . Hypertension   . Pneumonia   . Ovarian cancer   . Anxiety   . Depression   . Kidney stone    Past Surgical History  Procedure Laterality Date  . Abdominal hysterectomy    . Tubal ligation    . Hemorrhoid surgery     History reviewed. No pertinent family  history. History  Substance Use Topics  . Smoking status: Current Every Day Smoker -- 1.00 packs/day    Types: Cigarettes  . Smokeless tobacco: Never Used  . Alcohol Use: No   No OB history provided.   Review of Systems  A complete 10 system review of systems was obtained and all systems are negative except as noted in the HPI and PMH.   Allergies  Other  Home Medications   Current Outpatient Rx  Name  Route  Sig  Dispense  Refill  . citalopram (CELEXA) 40 MG tablet   Oral   Take 40 mg by mouth daily.         Marland Kitchen HYDROcodone-acetaminophen (NORCO/VICODIN) 5-325 MG per tablet   Oral   Take 1 tablet by mouth every 6 (six) hours as needed. 1 or 2 tabs PO q6 hours prn pain         . levothyroxine (SYNTHROID, LEVOTHROID) 112 MCG tablet   Oral   Take 1 tablet (112 mcg total) by mouth daily before breakfast.   30 tablet   3   . Multiple Vitamin (MULTIVITAMIN) tablet   Oral   Take 1 tablet by mouth daily.         . Omega-3 Fatty Acids (FISH OIL) 1000 MG CAPS  Oral   Take 2 capsules by mouth daily.         . ondansetron (ZOFRAN ODT) 8 MG disintegrating tablet   Oral   Take 1 tablet (8 mg total) by mouth every 8 (eight) hours as needed for nausea or vomiting.   10 tablet   0    Triage Vitals: BP 115/68  Pulse 84  Temp(Src) 99.8 F (37.7 C) (Oral)  Resp 22  Ht 5\' 3"  (1.6 m)  Wt 232 lb (105.235 kg)  BMI 41.11 kg/m2  SpO2 96%  Physical Exam  Nursing note and vitals reviewed. Constitutional: She is oriented to person, place, and time. She appears well-developed and well-nourished. No distress.  Oral temp is 100.4   HENT:  Head: Normocephalic and atraumatic.  No trismus, no malocclusion, no facial bruising or swelling  Eyes: EOM are normal.  Neck: Normal range of motion.  Cardiovascular: Normal rate, regular rhythm and normal heart sounds.   Pulmonary/Chest: Effort normal and breath sounds normal.  Abdominal: Soft. She exhibits no distension. There is no  tenderness.  Musculoskeletal: Normal range of motion.  Abrasion to the left knee. No significant swelling. FROM of left knee. No effusion. No joint tenderness.   Neurological: She is alert and oriented to person, place, and time.  Good strength in BUE  Skin: Skin is warm and dry.  Psychiatric: She has a normal mood and affect. Judgment normal.    ED Course  Procedures (including critical care time)  Medications  0.9 %  sodium chloride infusion (0 mLs Intravenous Stopped 03/24/13 0026)    Followed by  0.9 %  sodium chloride infusion (not administered)  ibuprofen (ADVIL,MOTRIN) tablet 800 mg (800 mg Oral Given 03/23/13 2326)  acetaminophen (TYLENOL) tablet 1,000 mg (1,000 mg Oral Given 03/24/13 0030)    DIAGNOSTIC STUDIES: Oxygen Saturation is 96% on RA, adequate by my interpretation.    COORDINATION OF CARE: 11:22 PM-Discussed treatment plan which includes Motrin for fever, CXR, CBC, BMP and troponin with pt at bedside and pt agreed to plan.    Labs Review Labs Reviewed  CBC WITH DIFFERENTIAL - Abnormal; Notable for the following:    Neutrophils Relative % 87 (*)    Lymphocytes Relative 10 (*)    All other components within normal limits  BASIC METABOLIC PANEL - Abnormal; Notable for the following:    Glucose, Bld 110 (*)    All other components within normal limits  TROPONIN I   Imaging Review Dg Chest 2 View  03/24/2013   CLINICAL DATA Nausea, weakness, hypertension, ovarian cancer, smoker.  EXAM CHEST  2 VIEW  COMPARISON 11/24/2012  FINDINGS Heart size upper normal to mildly enlarged, similar prior. Mild aortic tortuosity is similar to prior. Mild interstitial prominence is similar to prior. No confluent airspace opacity, pleural effusion, or pneumothorax. Mild multilevel degenerative changes. No acute osseous finding.  IMPRESSION Heart size upper normal to mildly enlarged. Mild chronic increased interstitial markings. No acute process identified.  SIGNATURE  Electronically  Signed   By: Carlos Levering M.D.   On: 03/24/2013 00:27     EKG Interpretation   Date/Time:  Tuesday March 23 2013 23:31:51 EDT Ventricular Rate:  84 PR Interval:  158 QRS Duration: 102 QT Interval:  370 QTC Calculation: 437 R Axis:   126 Text Interpretation:  Normal sinus rhythm Incomplete right bundle branch  block When compared with ECG of 24-Nov-2012 19:18, No significant change  was found Confirmed by Wilson Singer  MD, STEPHEN ([id number]) on  03/23/2013 11:37:35 PM      MDM   Final diagnoses:  Fever  Nausea vomiting and diarrhea   1:22 AM Pt feels better. Ambulatory in ER. Fever. Likely viral. Repeat abdominal exam benign. Vitals stable. Dc home with zofran. She understands to return to ER for new or worsening symptoms  I personally performed the services described in this documentation, which was scribed in my presence. The recorded information has been reviewed and is accurate.       Hoy Morn, MD 03/24/13 [phone]

## 2013-03-23 NOTE — ED Notes (Signed)
Pt c/o vomiting twice today with a brief syncopal episode on the way to the bathroom.

## 2013-03-24 LAB — TROPONIN I: Troponin I: <0.3 ng/mL (ref <0.30)

## 2013-03-24 LAB — BASIC METABOLIC PANEL
BUN: 19 mg/dL (ref 6–23)
CO2: 27 meq/L (ref 19–32)
CREATININE: 0.69 mg/dL (ref 0.50–1.10)
Calcium: 9 mg/dL (ref 8.4–10.5)
Chloride: 102 mEq/L (ref 96–112)
GFR calc Af Amer: >90 mL/min (ref >90)
GFR calc non Af Amer: >90 mL/min (ref >90)
Glucose, Bld: 110 mg/dL — ABNORMAL HIGH (ref 70–99)
Potassium: 4.7 mEq/L (ref 3.7–5.3)
Sodium: 141 mEq/L (ref 137–147)

## 2013-03-24 MED ORDER — ONDANSETRON 8 MG PO TBDP: 8.0000 mg | ORAL_TABLET | Freq: Three times a day (TID) | ORAL | Status: DC | PRN

## 2013-03-24 MED ORDER — ACETAMINOPHEN 500 MG PO TABS
1000.0000 mg | ORAL_TABLET | Freq: Once | ORAL | Status: AC
  Administered 2013-03-24: 1000 mg via ORAL
  Filled 2013-03-24: qty 2

## 2013-03-24 NOTE — Discharge Instructions (Signed)
Fever, Adult A fever is a higher than normal body temperature. In an adult, an oral temperature around 98.6 F (37 C) is considered normal. A temperature of 100.4 F (38 C) or higher is generally considered a fever. Mild or moderate fevers generally have no long-term effects and often do not require treatment. Extreme fever (greater than or equal to 106 F or 41.1 C) can cause seizures. The sweating that may occur with repeated or prolonged fever may cause dehydration. Elderly people can develop confusion during a fever. A measured temperature can vary with:  Age.  Time of day.  Method of measurement (mouth, underarm, rectal, or ear). The fever is confirmed by taking a temperature with a thermometer. Temperatures can be taken different ways. Some methods are accurate and some are not.  An oral temperature is used most commonly. Electronic thermometers are fast and accurate.  An ear temperature will only be accurate if the thermometer is positioned as recommended by the manufacturer.  A rectal temperature is accurate and done for those adults who have a condition where an oral temperature cannot be taken.  An underarm (axillary) temperature is not accurate and not recommended. Fever is a symptom, not a disease.  CAUSES   Infections commonly cause fever.  Some noninfectious causes for fever include:  Some arthritis conditions.  Some thyroid or adrenal gland conditions.  Some immune system conditions.  Some types of cancer.  A medicine reaction.  High doses of certain street drugs such as methamphetamine.  Dehydration.  Exposure to high outside or room temperatures.  Occasionally, the source of a fever cannot be determined. This is sometimes called a "fever of unknown origin" (FUO).  Some situations may lead to a temporary rise in body temperature that may go away on its own. Examples are:  Childbirth.  Surgery.  Intense exercise. HOME CARE INSTRUCTIONS   Take  appropriate medicines for fever. Follow dosing instructions carefully. If you use acetaminophen to reduce the fever, be careful to avoid taking other medicines that also contain acetaminophen. Do not take aspirin for a fever if you are younger than age 19. There is an association with Reye's syndrome. Reye's syndrome is a rare but potentially deadly disease.  If an infection is present and antibiotics have been prescribed, take them as directed. Finish them even if you start to feel better.  Rest as needed.  Maintain an adequate fluid intake. To prevent dehydration during an illness with prolonged or recurrent fever, you may need to drink extra fluid.Drink enough fluids to keep your urine clear or pale yellow.  Sponging or bathing with room temperature water may help reduce body temperature. Do not use ice water or alcohol sponge baths.  Dress comfortably, but do not over-bundle. SEEK MEDICAL CARE IF:   You are unable to keep fluids down.  You develop vomiting or diarrhea.  You are not feeling at least partly better after 3 days.  You develop new symptoms or problems. SEEK IMMEDIATE MEDICAL CARE IF:   You have shortness of breath or trouble breathing.  You develop excessive weakness.  You are dizzy or you faint.  You are extremely thirsty or you are making little or no urine.  You develop new pain that was not there before (such as in the head, neck, chest, back, or abdomen).  You have persistant vomiting and diarrhea for more than 1 to 2 days.  You develop a stiff neck or your eyes become sensitive to light.  You develop a   skin rash.  You have a fever or persistent symptoms for more than 2 to 3 days.  You have a fever and your symptoms suddenly get worse. MAKE SURE YOU:   Understand these instructions.  Will watch your condition.  Will get help right away if you are not doing well or get worse. Document Released: 06/26/2000 Document Revised: 03/25/2011 Document  Reviewed: 11/01/2010 [employer] Patient Information 2014 [employer].  

## 2013-05-04 ENCOUNTER — Telehealth: Payer: Self-pay | Admitting: Family Medicine

## 2013-05-04 MED ORDER — ALPRAZOLAM 0.5 MG PO TABS: 0.5000 mg | ORAL_TABLET | Freq: Three times a day (TID) | ORAL | Status: DC | PRN

## 2013-05-04 NOTE — Telephone Encounter (Signed)
ok 

## 2013-05-04 NOTE — Telephone Encounter (Signed)
Requesting refill on Xanax - ? OK to Refill  

## 2013-05-04 NOTE — Telephone Encounter (Signed)
rx called in

## 2013-05-17 ENCOUNTER — Other Ambulatory Visit: Payer: Self-pay | Admitting: Family Medicine

## 2013-05-17 NOTE — Telephone Encounter (Signed)
Refill appropriate and filled per protocol. 

## 2013-06-19 ENCOUNTER — Other Ambulatory Visit: Payer: Self-pay | Admitting: Family Medicine

## 2013-09-27 ENCOUNTER — Encounter: Payer: Self-pay | Admitting: Family Medicine

## 2013-09-27 ENCOUNTER — Ambulatory Visit (INDEPENDENT_AMBULATORY_CARE_PROVIDER_SITE_OTHER): Payer: PRIVATE HEALTH INSURANCE | Admitting: Family Medicine

## 2013-09-27 VITALS — BP 142/90 | HR 80 | Temp 98.8°F | Resp 16 | Ht 64.0 in | Wt 240.0 lb

## 2013-09-27 DIAGNOSIS — K5289 Other specified noninfective gastroenteritis and colitis: Secondary | ICD-10-CM

## 2013-09-27 DIAGNOSIS — G4710 Hypersomnia, unspecified: Secondary | ICD-10-CM

## 2013-09-27 DIAGNOSIS — K529 Noninfective gastroenteritis and colitis, unspecified: Secondary | ICD-10-CM

## 2013-09-27 DIAGNOSIS — G4730 Sleep apnea, unspecified: Secondary | ICD-10-CM | POA: Insufficient documentation

## 2013-09-27 MED ORDER — METRONIDAZOLE 500 MG PO TABS: 500.0000 mg | ORAL_TABLET | Freq: Two times a day (BID) | ORAL | Status: DC

## 2013-09-27 NOTE — Progress Notes (Signed)
Subjective:    Patient ID: [name] [name], female    DOB: [birth year], 48 y.o.   MRN: [id number]  HPI Patient has been visiting her father in law who recently had surgery and is in stable. Recently he was diagnosed with C. Difficile colitis. Over the last 24 hours, she has developed profuse watery diarrhea and lower abdominal cramps. She denies any other sick contacts. She denies any fevers or chills. She denies any melena or hematochezia. 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  She also has a history of sleep apnea and is compliant with her CPAP machine. However she continues to have hypersomnolence. She can easily fall asleep during the day he she is not active. She is easily able to fall sleep after eating or after taking a snack. She is easily able to fall sleep and she is reading or watching TV. She does not seem to be improving on CPAP. Past Medical History  Diagnosis Date  . Thyroid disease   . Hypertension   . Pneumonia   . Ovarian cancer   . Anxiety   . Depression   . Kidney stone   . Sleep apnea    Past Surgical History  Procedure Laterality Date  . Abdominal hysterectomy    . Tubal ligation    . Hemorrhoid surgery     Current Outpatient Prescriptions on File Prior to Visit  Medication Sig Dispense Refill  . ALPRAZolam (XANAX) 0.5 MG tablet Take 1 tablet (0.5 mg total) by mouth 3 (three) times daily as needed for anxiety. Sleep and/or anxiety  60 tablet  0  . citalopram (CELEXA) 40 MG tablet Take 40 mg by mouth daily.      Marland Kitchen levothyroxine (SYNTHROID, LEVOTHROID) 112 MCG tablet TAKE ONE TABLET BY MOUTH ONCE DAILY  30 tablet  3  . Multiple Vitamin (MULTIVITAMIN) tablet Take 1 tablet by mouth daily.      . Omega-3 Fatty Acids (FISH OIL) 1000 MG CAPS Take 2 capsules by mouth daily.      . ondansetron (ZOFRAN ODT) 8 MG disintegrating tablet Take 1 tablet (8 mg total) by mouth every 8 (eight) hours as needed for nausea or vomiting.  10 tablet  0   No current  facility-administered medications on file prior to visit.   No results found for this basename: ABORH,  in the last 168 hours  History   Social History  . Marital Status: Married    Spouse Name: N/A    Number of Children: N/A  . Years of Education: N/A   Occupational History  . Not on file.   Social History Main Topics  . Smoking status: Current Every Day Smoker -- 1.00 packs/day    Types: Cigarettes  . Smokeless tobacco: Never Used  . Alcohol Use: No  . Drug Use: No  . Sexual Activity: Not on file   Other Topics Concern  . Not on file   Social History Narrative  . No narrative on file       Review of Systems  All other systems reviewed and are negative.      Objective:   Physical Exam  Vitals reviewed. Constitutional: She appears well-developed and well-nourished. No distress.  Neck: Neck supple. No thyromegaly present.  Cardiovascular: Normal rate and regular rhythm.   No murmur heard. Pulmonary/Chest: Effort normal and breath sounds normal. No respiratory distress. She has no wheezes. She has no rales.  Abdominal: Soft. Bowel sounds are normal. She exhibits no distension. There  is no tenderness. There is no rebound.  Skin: She is not diaphoretic.          Assessment & Plan:  Colitis - Plan: metroNIDAZOLE (FLAGYL) 500 MG tablet, Gastrointestinal Pathogen Panel PCR  Hypersomnia with sleep apnea, unspecified - Plan: Ambulatory referral to Neurology  I am concerned that patient may have C. Difficile colitis. I will send a stool GI pathogen PCR. After the patient has returned her stool sample, however to begin Flagyl 500 mg by mouth twice a day for 14 days. If the stool sample was negative for C. Difficile colitis she can stop Flagyl. She is not to return to work until she is asymptomatic. I also consult neurology regarding the patient's hypersomnia. The patient may benefit from BiPAP therapy. I am also concerned she may have narcolepsy as a potential cause of  her excessive daytime sleepiness.

## 2013-09-28 LAB — GASTROINTESTINAL PATHOGEN PANEL PCR
C. difficile Tox A/B, PCR: NEGATIVE
CRYPTOSPORIDIUM, PCR: NEGATIVE
Campylobacter, PCR: NEGATIVE
E COLI (ETEC) LT/ST, PCR: NEGATIVE
E COLI (STEC) STX1/STX2, PCR: NEGATIVE
E COLI 0157, PCR: NEGATIVE
Giardia lamblia, PCR: NEGATIVE
Norovirus, PCR: NEGATIVE
Rotavirus A, PCR: NEGATIVE
SALMONELLA, PCR: NEGATIVE
SHIGELLA, PCR: NEGATIVE

## 2013-10-01 ENCOUNTER — Telehealth: Payer: Self-pay | Admitting: Family Medicine

## 2013-10-01 ENCOUNTER — Other Ambulatory Visit: Payer: PRIVATE HEALTH INSURANCE

## 2013-10-01 DIAGNOSIS — K5289 Other specified noninfective gastroenteritis and colitis: Secondary | ICD-10-CM

## 2013-10-01 NOTE — Telephone Encounter (Signed)
Patient wanting to know if fmla papers have been faxed, i looked and could not find them  [phone]

## 2013-10-01 NOTE — Telephone Encounter (Signed)
She just dropped them off yesterday and we require a 7-10 business days to fill them out.

## 2013-10-03 LAB — CLOSTRIDIUM DIFFICILE BY PCR: Toxigenic C. Difficile by PCR: NOT DETECTED

## 2013-10-04 ENCOUNTER — Telehealth: Payer: Self-pay | Admitting: *Deleted

## 2013-10-04 ENCOUNTER — Encounter (INDEPENDENT_AMBULATORY_CARE_PROVIDER_SITE_OTHER): Payer: Self-pay

## 2013-10-04 ENCOUNTER — Encounter: Payer: Self-pay | Admitting: *Deleted

## 2013-10-04 ENCOUNTER — Encounter: Payer: Self-pay | Admitting: Neurology

## 2013-10-04 ENCOUNTER — Ambulatory Visit (INDEPENDENT_AMBULATORY_CARE_PROVIDER_SITE_OTHER): Payer: PRIVATE HEALTH INSURANCE | Admitting: Neurology

## 2013-10-04 VITALS — BP 138/90 | HR 81 | Resp 17 | Ht 64.0 in | Wt 245.0 lb

## 2013-10-04 DIAGNOSIS — Z9989 Dependence on other enabling machines and devices: Principal | ICD-10-CM

## 2013-10-04 DIAGNOSIS — G4733 Obstructive sleep apnea (adult) (pediatric): Secondary | ICD-10-CM

## 2013-10-04 DIAGNOSIS — G4710 Hypersomnia, unspecified: Secondary | ICD-10-CM

## 2013-10-04 DIAGNOSIS — R5383 Other fatigue: Secondary | ICD-10-CM | POA: Insufficient documentation

## 2013-10-04 DIAGNOSIS — R5381 Other malaise: Secondary | ICD-10-CM | POA: Insufficient documentation

## 2013-10-04 MED ORDER — ALPRAZOLAM 0.5 MG PO TABS: 0.5000 mg | ORAL_TABLET | Freq: Three times a day (TID) | ORAL | Status: DC | PRN

## 2013-10-04 NOTE — Telephone Encounter (Signed)
ok 

## 2013-10-04 NOTE — Telephone Encounter (Signed)
Received call from patient requesting refill on Xanax.   Ok to refill??  Last office visit 09/27/2013.  Last refill 05/04/2013.

## 2013-10-04 NOTE — Telephone Encounter (Signed)
Medication called to pharmacy. 

## 2013-10-04 NOTE — Progress Notes (Signed)
Chief Complaint  Patient presents with  . New Evaluation    Room 11  . Sleep consult       Provider:  Larey Seat, M D  Referring Provider: Susy Frizzle, MD Primary Care Physician:  Odette Fraction, MD  Chief Complaint  Patient presents with  . New Evaluation    Room 11  . Sleep consult    HPI:  [name] [name] is a 48 y.o. caucasian , left handed , maried female , who is seen here in an initial consultation  from Dr. Dennard Schaumann for sleep apnea evaluation.  [name] reports that she was evaluated in [place] [facility] for a sleep apnea study took place on 03-05-11 two and one half years ago.  At the time the patient's past medical history listed fatty liver disease and obesity as well as continued nicotine use.  The patient was diagnosed with "severe obstructive sleep apnea" that night. To my surprise,  she was titrated to 18 cm water which is a very high pressure.  However under this pressure she was reportedly not longer snoring . The mask that was used at night was a resident medium sized FF mirage  mask a full face model.  Unfortunately I could not see her baseline AHI to be reported anywhere.  Her Epworth sleepiness score was endorsed at 18 and her BMI was measured at 45.   [name] states today that she has made a conscientious effort to use her CPAP machine each day however she feels not refreshed or restored in the morning and is still excessively daytime sleepy and fatigued. She continues to smoke. She denies any frequent bronchitis or asthmatic exacerbations. She has never been diagnosed with emphysema. Her current BMI is 41.1 the she did lose weight since her initial sleep study. 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 Not third shift, she works from  7 AM to 7 PM.  She goes to bed at 9.30 PM and will go to sleep promptly. Her sleep is interrupted by nocturia,  at least once, before CPAP 3-4 nocturias.  Her husband feels bothered by the cold air shower escaping the FFM and not about snoring. She rises at 3.30 AM and has slept usually only 5.5 hours. I couldn't find out why she wouldn't leave the bed at 5 AM, well in time for a 20 minute commute. On week-ends she wakes up at 6.30 AM. Dry mouth in the morning, sometimes headaches - rarely. She is not aware of air leaks.  She naps only rarely in daytime. At work , she has a prolonged mid day break and falls asleep in front of the charts.            Review of Systems: Out of a complete 14 system review, the patient complains of only the following symptoms, and all other reviewed systems are negative.  Epworth score of 13 , FSS of 58 and GDS was not obtained, PHQ was given to patient to bring to next appointment. Diaphoretic all day, nocturia. Vivid dreams,  The patient feels threatend. No balance loss, no sleep paralysis, no dream intrusion, no loss of taste or smell,, no hallucinations. No cataplexy.     History   Social History  . Marital Status: Married    Spouse Name: [name] [name]    Number of Children: 1  . Years of Education: 23  Occupational History  .  [place] History Main Topics  . Smoking status: Current Every Day Smoker -- 1.00 packs/day    Types: Cigarettes  . Smokeless tobacco: Never Used  . Alcohol Use: No  . Drug Use: No  . Sexual Activity: Not on file   Other Topics Concern  . Not on file   Social History Narrative   Patient is married [name] [name]) and lives at home with her husband.   Patient has one adult child.   Patient is working full-time.   Patient has a high school education.   Patient is left-handed.   Patient drinks a 2 liter per day.    Family History  Problem Relation Age of Onset  . Thyroid disease Maternal Grandmother   . Parkinson's disease Mother   . Lymphoma Maternal Grandmother   . Heart Problems Father     Past Medical History   Diagnosis Date  . Thyroid disease   . Hypertension   . Pneumonia   . Ovarian cancer   . Anxiety   . Depression   . Kidney stone   . Sleep apnea   . Tonsillar and adenoid hypertrophy   . OSA on CPAP   . Nicotine dependence, cigarettes, with other nicotine-induced disorders   . Nocturia     Past Surgical History  Procedure Laterality Date  . Abdominal hysterectomy  2003  . Tubal ligation    . Hemorrhoid surgery  2007    Current Outpatient Prescriptions  Medication Sig Dispense Refill  . ALPRAZolam (XANAX) 0.5 MG tablet Take 1 tablet (0.5 mg total) by mouth 3 (three) times daily as needed for anxiety. Sleep and/or anxiety  60 tablet  0  . citalopram (CELEXA) 40 MG tablet Take 40 mg by mouth daily.      Marland Kitchen levothyroxine (SYNTHROID, LEVOTHROID) 112 MCG tablet TAKE ONE TABLET BY MOUTH ONCE DAILY  30 tablet  3  . Multiple Vitamin (MULTIVITAMIN) tablet Take 1 tablet by mouth daily.      . Omega-3 Fatty Acids (FISH OIL) 1000 MG CAPS Take 2 capsules by mouth daily.       No current facility-administered medications for this visit.    Allergies as of 10/04/2013 - Review Complete 10/04/2013  Allergen Reaction Noted  . Other  11/03/2012    Vitals: BP 138/90  Pulse 81  Resp 17  Ht 5\' 4"  (1.626 m)  Wt 245 lb (111.131 kg)  BMI 42.03 kg/m2 Last Weight:  Wt Readings from Last 1 Encounters:  10/04/13 245 lb (111.131 kg)   Last Height:   Ht Readings from Last 1 Encounters:  10/04/13 5\' 4"  (1.626 m)    Physical exam:  General: The patient is awake, alert and appears not in acute distress. The patient is well groomed, she appears masculine.  Head: Normocephalic, atraumatic. Neck is supple. Mallampati 4 , neck circumference: 17.5 inches . Cardiovascular:  Regular rate and rhythm , without  murmurs or carotid bruit, and without distended neck veins. Respiratory: Lungs are clear to auscultation. Patient smells of smoke.  Skin:  Without evidence of edema, or rash Trunk: BMI is 41  , elevated - patient  has normal posture.  Neurologic exam : The patient is awake and alert, oriented to place and time.  Memory subjective described as intact.  There is a normal attention span & concentration ability. Speech is fluent without  dysarthria, dysphonia or aphasia.  Mood and affect are appropriate.  Cranial nerves: Pupils are equal  and briskly reactive to light. Funduscopic exam without evidence of pallor or edema.  Extraocular movements  in vertical and horizontal planes intact and without nystagmus.  Visual fields by finger perimetry are intact. Hearing to finger rub intact.  Facial sensation intact to fine touch.  Facial motor strength is symmetric and tongue and uvula move midline.  Tongue protrusion into either cheek is normal. Shoulder shrug is normal.   Motor exam: Normal tone ,muscle bulk and symmetric  strength in all extremities. She has a good grip strength. She reports some numbness in the right hand when crocheing or knitting.   Sensory:  Fine touch, pinprick and vibration were tested in all extremities. Proprioception was normal.  Coordination: Rapid alternating movements in the fingers/hands were normal. Finger-to-nose maneuver normal without evidence of ataxia, dysmetria or tremor.  Gait and station: Patient walks without assistive device and is able unassisted to climb up to the exam table.  Strength within normal limits. Stance is stable and normal. Tandem gait is unfragmented.  Deep tendon reflexes: in the  upper and lower extremities are delayed, but than brisk.   Babinski maneuver response is downgoing.   Assessment:  After physical and neurologic examination, review of laboratory studies, imaging, neurophysiology testing and pre-existing records, assessment is that of :   OSA risk factors include a neck circumference over 16 inches, BMI over 26 , retrognathia. She has a very narrow airway due to tonsils and adenoids. The uvula is not visible.   She  wear dentures , full top and partial lower.   She has numbness in both hands, but mainly the right when repetitively moving, typing etc, - carpal tunnel likely.   Plan:  Treatment plan and additional workup : SPLIT night with CO2 and split at 15, 3% score.  NCS with EMG at right wrist.  Not optimally treated for anxiety or nervousness on ativan , is parallel on antidepressant ( celexa at 40 mg ).  Patient has failed nasal pillow and nasal masks she stated, now on [facility])     Asencion Partridge Keerstin Bjelland MD 10/04/2013

## 2013-10-04 NOTE — Patient Instructions (Signed)
Polysomnography (Sleep Studies) Polysomnography (PSG) is a series of tests used for detecting (diagnosing) obstructive sleep apnea and other sleep disorders. The tests measure how some parts of your body are working while you are sleeping. The tests are extensive and expensive. They are done in a sleep lab or hospital, and vary from center to center. Your caregiver may perform other more simple sleep studies and questionnaires before doing more complete and involved testing. Testing may not be covered by insurance. Some of these tests are:  An EEG (Electroencephalogram). This tests your brain waves and stages of sleep.  An EOG (Electrooculogram). This measures the movements of your eyes. It detects periods of REM (rapid eye movement) sleep, which is your dream sleep.  An EKG (Electrocardiogram). This measures your heart rhythm.  EMG (Electromyography). This is a measurement of how the muscles are working in your upper airway and your legs while sleeping.  An oximetry measurement. It measures how much oxygen (air) you are getting while sleeping.  Breathing efforts may be measured. The same test can be interpreted (understood) differently by different caregivers and centers that study sleep.  Studies may be given an apnea/hypopnea index (AHI). This is a number which is found by counting the times of no breathing or under breathing during the night, and relating those numbers to the amount of time spent in bed. When the AHI is greater than 15, the patient is likely to complain of daytime sleepiness. When the AHI is greater than 30, the patient is at increased risk for heart problems and must be followed more closely. Following the AHI also allows you to know how treatment is working. Simple oximetry (tracking the amount of oxygen that is taken in) can be used for screening patients who:  Do not have symptoms (problems) of OSA.  Have a normal Epworth Sleepiness Scale Score.  Have a low pre-test  probability of having OSA.  Have none of the upper airway problems likely to cause apnea.  Oximetry is also used to determine if treatment is effective in patients who showed significant desaturations (not getting enough oxygen) on their home sleep study. One extra measure of safety is to perform additional studies for the person who only snores. This is because no one can predict with absolute certainty who will have OSA. Those who show significant desaturations (not getting enough oxygen) are recommended to have a more detailed sleep study. Document Released: 07/07/2002 Document Revised: 03/25/2011 Document Reviewed: 03/08/2013 ExitCare Patient Information 2015 [employer]. This information is not intended to replace advice given to you by your health care provider. Make sure you discuss any questions you have with your health care provider.  

## 2013-10-12 ENCOUNTER — Telehealth: Payer: Self-pay | Admitting: *Deleted

## 2013-10-12 NOTE — Telephone Encounter (Signed)
I called and LMVM for pt that HLA typing test for narcolepsy negative.  She is to call back as needed for questions.

## 2013-10-13 ENCOUNTER — Ambulatory Visit (INDEPENDENT_AMBULATORY_CARE_PROVIDER_SITE_OTHER): Payer: PRIVATE HEALTH INSURANCE | Admitting: Neurology

## 2013-10-13 ENCOUNTER — Encounter (INDEPENDENT_AMBULATORY_CARE_PROVIDER_SITE_OTHER): Payer: Self-pay | Admitting: Radiology

## 2013-10-13 DIAGNOSIS — R5383 Other fatigue: Secondary | ICD-10-CM

## 2013-10-13 DIAGNOSIS — Z9989 Dependence on other enabling machines and devices: Principal | ICD-10-CM

## 2013-10-13 DIAGNOSIS — G5600 Carpal tunnel syndrome, unspecified upper limb: Secondary | ICD-10-CM

## 2013-10-13 DIAGNOSIS — G4710 Hypersomnia, unspecified: Secondary | ICD-10-CM

## 2013-10-13 DIAGNOSIS — R5381 Other malaise: Secondary | ICD-10-CM

## 2013-10-13 DIAGNOSIS — G4733 Obstructive sleep apnea (adult) (pediatric): Secondary | ICD-10-CM

## 2013-10-13 DIAGNOSIS — Z0289 Encounter for other administrative examinations: Secondary | ICD-10-CM

## 2013-10-13 DIAGNOSIS — G5603 Carpal tunnel syndrome, bilateral upper limbs: Secondary | ICD-10-CM

## 2013-10-13 NOTE — Procedures (Signed)
   NCS (NERVE CONDUCTION STUDY) WITH EMG (ELECTROMYOGRAPHY) REPORT   STUDY DATE: October 13 2013 PATIENT NAME: [name] [name] DOB: 13-Jan-1966 MRN: [id number]    TECHNOLOGIST: Towana Badger ELECTROMYOGRAPHER: Marcial Pacas M.D.  CLINICAL INFORMATION:  48 year old right-handed female, with past medical history of hypothyroidism, presenting with few years history of intermittent bilateral hands paresthesia, right worse than left, she denied weakness  On examination: Bilateral abductor pollicis brevis, opponents muscle strength was normal. Decreased pinprick at first 3 fingerpads, positive bilateral wrists Tinel signs.  FINDINGS: NERVE CONDUCTION STUDY: Bilateral ulnar mixed, sensory, and motor responses were normal. Bilateral medial mixed, and sensory responses showed mild to moderately prolonged peak latency, with preserved snap amplitude. The right median motor responses showed prolonged distal latency, with normal C. map amplitude, conduction velocity. Left median motor responses showed distal latency is at the upper limit of normal range, with normal C. map amplitude.  NEEDLE ELECTROMYOGRAPHY: Selected needle examination was performed at right upper extremity muscles, right cervical paraspinal muscles.  Needle examination of right pronator teres, biceps, triceps, deltoid, first dorsal interossei was normal   There was no spontaneous activity at right cervical paraspinal muscles, right C5, 6 and 7.  Right abductor pollicis brevis: Normally insertion activity, no spontaneous activity, normal morphology motor unit potential with mildly decreased recruitment patterns.   IMPRESSION:   This is an abnormal study. There is electrodiagnostic evidence of bilateral median neuropathy across the wrist, consistent with bilateral carpal tunnel syndromes. Right side is moderate, left-sided is mild. I have suggested her bilateral wrist splints, NSAIDs as needed. She is not a surgical candidate at this  point.   INTERPRETING PHYSICIAN:   Marcial Pacas M.D. Ph.D. [facility] [address], [place], [place] [address] [phone]

## 2013-10-25 ENCOUNTER — Other Ambulatory Visit: Payer: Self-pay | Admitting: Family Medicine

## 2013-10-25 ENCOUNTER — Telehealth: Payer: Self-pay | Admitting: Neurology

## 2013-10-25 NOTE — Telephone Encounter (Signed)
Medication filled x1 with no refills.   Requires office visit before any further refills can be given.   Letter sent.  

## 2013-10-25 NOTE — Telephone Encounter (Signed)
Patient is not allowed to take phone calls at work.  The patient is acc. To Dr Krista Blue not a surgical candidate , and wrist braces work best as conservative treatment for carpal tunnel syndrome. Dr. Krista Blue wrote that in her reports, too.

## 2013-10-25 NOTE — Telephone Encounter (Signed)
See Dr. Edwena Felty note:

## 2013-10-27 ENCOUNTER — Telehealth: Payer: Self-pay | Admitting: *Deleted

## 2013-10-27 NOTE — Telephone Encounter (Signed)
Patient returning call to Sandy, please call and advise.  °

## 2013-10-27 NOTE — Telephone Encounter (Signed)
Patient was informed Narcolepsy test negative. She was also informed that she is not a surgical candidate for carpal tunnel release. She would benefit best and more conservatively by purchasing braces. Patient verbalizes understanding.

## 2013-11-11 ENCOUNTER — Other Ambulatory Visit: Payer: PRIVATE HEALTH INSURANCE

## 2013-11-11 ENCOUNTER — Other Ambulatory Visit: Payer: Self-pay | Admitting: Family Medicine

## 2013-11-11 DIAGNOSIS — Z79899 Other long term (current) drug therapy: Secondary | ICD-10-CM

## 2013-11-11 DIAGNOSIS — E039 Hypothyroidism, unspecified: Secondary | ICD-10-CM

## 2013-11-11 DIAGNOSIS — E669 Obesity, unspecified: Secondary | ICD-10-CM

## 2013-11-11 LAB — COMPLETE METABOLIC PANEL WITH GFR
ALK PHOS: 63 U/L (ref 39–117)
ALT: 20 U/L (ref 0–35)
AST: 18 U/L (ref 0–37)
Albumin: 4 g/dL (ref 3.5–5.2)
BILIRUBIN TOTAL: 0.3 mg/dL (ref 0.2–1.2)
BUN: 14 mg/dL (ref 6–23)
CO2: 26 mEq/L (ref 19–32)
Calcium: 9.6 mg/dL (ref 8.4–10.5)
Chloride: 104 mEq/L (ref 96–112)
Creat: 0.65 mg/dL (ref 0.50–1.10)
GFR, Est African American: >89 mL/min
GFR, Est Non African American: >89 mL/min
Glucose, Bld: 125 mg/dL — ABNORMAL HIGH (ref 70–99)
Potassium: 4.6 mEq/L (ref 3.5–5.3)
Sodium: 137 mEq/L (ref 135–145)
Total Protein: 7 g/dL (ref 6.0–8.3)

## 2013-11-11 LAB — LIPID PANEL
CHOL/HDL RATIO: 5.9 ratio
CHOLESTEROL: 177 mg/dL (ref 0–200)
HDL: 30 mg/dL — ABNORMAL LOW (ref >39)
Triglycerides: 437 mg/dL — ABNORMAL HIGH (ref <150)

## 2013-11-11 LAB — CBC WITH DIFFERENTIAL/PLATELET
BASOS PCT: 1 % (ref 0–1)
Basophils Absolute: 0.1 10*3/uL (ref 0.0–0.1)
EOS PCT: 1 % (ref 0–5)
Eosinophils Absolute: 0.1 10*3/uL (ref 0.0–0.7)
HCT: 39.8 % (ref 36.0–46.0)
HEMOGLOBIN: 13.7 g/dL (ref 12.0–15.0)
Lymphocytes Relative: 47 % — ABNORMAL HIGH (ref 12–46)
Lymphs Abs: 2.6 10*3/uL (ref 0.7–4.0)
MCH: 30.8 pg (ref 26.0–34.0)
MCHC: 34.4 g/dL (ref 30.0–36.0)
MCV: 89.4 fL (ref 78.0–100.0)
MONO ABS: 0.3 10*3/uL (ref 0.1–1.0)
Monocytes Relative: 5 % (ref 3–12)
Neutro Abs: 2.5 10*3/uL (ref 1.7–7.7)
Neutrophils Relative %: 46 % (ref 43–77)
PLATELETS: 257 10*3/uL (ref 150–400)
RBC: 4.45 MIL/uL (ref 3.87–5.11)
RDW: 15.5 % (ref 11.5–15.5)
WBC: 5.5 10*3/uL (ref 4.0–10.5)

## 2013-11-11 LAB — TSH: TSH: 2.542 u[IU]/mL (ref 0.350–4.500)

## 2013-11-17 LAB — HEMOGLOBIN A1C
Hgb A1c MFr Bld: 5.7 % — ABNORMAL HIGH (ref <5.7)
Mean Plasma Glucose: 117 mg/dL — ABNORMAL HIGH (ref <117)

## 2013-11-19 ENCOUNTER — Other Ambulatory Visit: Payer: Self-pay | Admitting: Family Medicine

## 2013-11-19 DIAGNOSIS — E782 Mixed hyperlipidemia: Secondary | ICD-10-CM

## 2013-11-19 DIAGNOSIS — Z79899 Other long term (current) drug therapy: Secondary | ICD-10-CM

## 2013-11-19 MED ORDER — FENOFIBRATE 160 MG PO TABS: 160.0000 mg | ORAL_TABLET | Freq: Every day | ORAL | Status: DC

## 2013-11-29 ENCOUNTER — Other Ambulatory Visit: Payer: Self-pay | Admitting: Family Medicine

## 2013-12-07 ENCOUNTER — Encounter: Payer: PRIVATE HEALTH INSURANCE | Admitting: Family Medicine

## 2014-03-16 ENCOUNTER — Telehealth: Payer: Self-pay | Admitting: Family Medicine

## 2014-03-16 NOTE — Telephone Encounter (Signed)
Patient called in requesting refill on her xanax. She uses [facility] in [place].

## 2014-03-16 NOTE — Telephone Encounter (Signed)
?  ok to refill  Last refill 10/04/2013  Last office visit 09/28/13

## 2014-03-17 MED ORDER — ALPRAZOLAM 0.5 MG PO TABS: 0.5000 mg | ORAL_TABLET | Freq: Three times a day (TID) | ORAL | Status: DC | PRN

## 2014-03-17 NOTE — Telephone Encounter (Signed)
Script called into [facility], pt aware script has been called in and needs office visit before next refill

## 2014-03-17 NOTE — Telephone Encounter (Signed)
ok 

## 2014-04-19 ENCOUNTER — Telehealth: Payer: Self-pay | Admitting: Family Medicine

## 2014-04-19 NOTE — Telephone Encounter (Deleted)
Patient called in to say that he feels like he is in more pain after getting cortizone shot from yesterday please call him at [phone]

## 2014-04-22 ENCOUNTER — Other Ambulatory Visit: Payer: Self-pay | Admitting: Family Medicine

## 2014-04-22 ENCOUNTER — Other Ambulatory Visit: Payer: PRIVATE HEALTH INSURANCE

## 2014-04-22 DIAGNOSIS — E039 Hypothyroidism, unspecified: Secondary | ICD-10-CM

## 2014-04-22 DIAGNOSIS — R7309 Other abnormal glucose: Secondary | ICD-10-CM

## 2014-04-22 DIAGNOSIS — Z79899 Other long term (current) drug therapy: Secondary | ICD-10-CM

## 2014-04-22 DIAGNOSIS — E669 Obesity, unspecified: Secondary | ICD-10-CM

## 2014-04-22 DIAGNOSIS — Z0000 Encounter for general adult medical examination without abnormal findings: Secondary | ICD-10-CM

## 2014-04-22 LAB — COMPLETE METABOLIC PANEL WITH GFR
ALBUMIN: 4 g/dL (ref 3.5–5.2)
ALT: 22 U/L (ref 0–35)
AST: 19 U/L (ref 0–37)
Alkaline Phosphatase: 63 U/L (ref 39–117)
BUN: 16 mg/dL (ref 6–23)
CO2: 21 mEq/L (ref 19–32)
Calcium: 9.3 mg/dL (ref 8.4–10.5)
Chloride: 104 mEq/L (ref 96–112)
Creat: 0.54 mg/dL (ref 0.50–1.10)
GLUCOSE: 86 mg/dL (ref 70–99)
POTASSIUM: 4.3 meq/L (ref 3.5–5.3)
SODIUM: 137 meq/L (ref 135–145)
TOTAL PROTEIN: 6.9 g/dL (ref 6.0–8.3)
Total Bilirubin: 0.4 mg/dL (ref 0.2–1.2)

## 2014-04-22 LAB — CBC WITH DIFFERENTIAL/PLATELET
BASOS PCT: 0 % (ref 0–1)
Basophils Absolute: 0 10*3/uL (ref 0.0–0.1)
EOS PCT: 2 % (ref 0–5)
Eosinophils Absolute: 0.1 10*3/uL (ref 0.0–0.7)
HCT: 41.7 % (ref 36.0–46.0)
Hemoglobin: 13.7 g/dL (ref 12.0–15.0)
Lymphocytes Relative: 49 % — ABNORMAL HIGH (ref 12–46)
Lymphs Abs: 2.7 10*3/uL (ref 0.7–4.0)
MCH: 29.6 pg (ref 26.0–34.0)
MCHC: 32.9 g/dL (ref 30.0–36.0)
MCV: 90.1 fL (ref 78.0–100.0)
MPV: 9.2 fL (ref 8.6–12.4)
Monocytes Absolute: 0.3 10*3/uL (ref 0.1–1.0)
Monocytes Relative: 6 % (ref 3–12)
NEUTROS PCT: 43 % (ref 43–77)
Neutro Abs: 2.4 10*3/uL (ref 1.7–7.7)
PLATELETS: 246 10*3/uL (ref 150–400)
RBC: 4.63 MIL/uL (ref 3.87–5.11)
RDW: 15.8 % — AB (ref 11.5–15.5)
WBC: 5.6 10*3/uL (ref 4.0–10.5)

## 2014-04-22 LAB — HEMOGLOBIN A1C
Hgb A1c MFr Bld: 5.9 % — ABNORMAL HIGH (ref <5.7)
Mean Plasma Glucose: 123 mg/dL — ABNORMAL HIGH (ref <117)

## 2014-04-22 LAB — LIPID PANEL
Cholesterol: 170 mg/dL (ref 0–200)
HDL: 25 mg/dL — ABNORMAL LOW (ref >=46)
LDL CALC: 92 mg/dL (ref 0–99)
TRIGLYCERIDES: 263 mg/dL — AB (ref <150)
Total CHOL/HDL Ratio: 6.8 Ratio
VLDL: 53 mg/dL — AB (ref 0–40)

## 2014-04-22 LAB — TSH: TSH: 2.511 u[IU]/mL (ref 0.350–4.500)

## 2014-04-27 ENCOUNTER — Ambulatory Visit (INDEPENDENT_AMBULATORY_CARE_PROVIDER_SITE_OTHER): Payer: PRIVATE HEALTH INSURANCE | Admitting: Family Medicine

## 2014-04-27 ENCOUNTER — Encounter: Payer: Self-pay | Admitting: Family Medicine

## 2014-04-27 VITALS — BP 134/90 | HR 76 | Temp 98.4°F | Resp 18 | Ht 63.5 in | Wt 248.0 lb

## 2014-04-27 DIAGNOSIS — R011 Cardiac murmur, unspecified: Secondary | ICD-10-CM | POA: Diagnosis not present

## 2014-04-27 DIAGNOSIS — Z0000 Encounter for general adult medical examination without abnormal findings: Secondary | ICD-10-CM | POA: Diagnosis not present

## 2014-04-27 DIAGNOSIS — J351 Hypertrophy of tonsils: Secondary | ICD-10-CM | POA: Diagnosis not present

## 2014-04-27 DIAGNOSIS — F411 Generalized anxiety disorder: Secondary | ICD-10-CM

## 2014-04-27 DIAGNOSIS — Z1231 Encounter for screening mammogram for malignant neoplasm of breast: Secondary | ICD-10-CM

## 2014-04-27 MED ORDER — LEVOTHYROXINE SODIUM 112 MCG PO TABS: 112.0000 ug | ORAL_TABLET | Freq: Every day | ORAL | Status: DC

## 2014-04-27 MED ORDER — FENOFIBRATE 160 MG PO TABS: 160.0000 mg | ORAL_TABLET | Freq: Every day | ORAL | Status: DC

## 2014-04-27 MED ORDER — DULOXETINE HCL 60 MG PO CPEP
60.0000 mg | ORAL_CAPSULE | Freq: Every day | ORAL | Status: DC
Start: 2014-04-27 — End: 2014-09-20

## 2014-04-27 NOTE — Addendum Note (Signed)
Addended by: Shary Decamp B on: 04/27/2014 08:40 AM   Modules accepted: Orders, Medications

## 2014-04-27 NOTE — Progress Notes (Signed)
Subjective:    Patient ID: [name] [name], female    DOB: 1965-11-14, 49 y.o.   MRN: [id number]  HPI Patient is here for CPE.  She is complaining of worsening anxiety. She feels constantly anxious. She no longer feels that the citalopram is helping. She is having to care for her parents as well as her aunt and uncle while working a full-time job. This is causing a great deal of stress for her. She is occasionally having panic attacks. The Xanax no longer helps. She is having difficulty sleeping. She does report some mild depression and anhedonia. She is also overdue for mammogram as well as a Pap smear. Her hysterectomy was performed due to ovarian cancer.  Past Medical History  Diagnosis Date  . Thyroid disease   . Hypertension   . Pneumonia   . Ovarian cancer   . Anxiety   . Depression   . Kidney stone   . Sleep apnea   . Tonsillar and adenoid hypertrophy   . OSA on CPAP   . Nicotine dependence, cigarettes, with other nicotine-induced disorders   . Nocturia    Past Surgical History  Procedure Laterality Date  . Abdominal hysterectomy  2003  . Tubal ligation    . Hemorrhoid surgery  2007   Current Outpatient Prescriptions on File Prior to Visit  Medication Sig Dispense Refill  . ALPRAZolam (XANAX) 0.5 MG tablet Take 1 tablet (0.5 mg total) by mouth 3 (three) times daily as needed for anxiety. Sleep and/or anxiety 60 tablet 0  . citalopram (CELEXA) 40 MG tablet Take 40 mg by mouth daily.    . citalopram (CELEXA) 40 MG tablet TAKE ONE TABLET BY MOUTH ONCE DAILY 30 tablet 3  . fenofibrate 160 MG tablet Take 1 tablet (160 mg total) by mouth daily. 30 tablet 3  . levothyroxine (SYNTHROID, LEVOTHROID) 112 MCG tablet Take 1 tablet (112 mcg total) by mouth daily before breakfast. 30 tablet 3  . Multiple Vitamin (MULTIVITAMIN) tablet Take 1 tablet by mouth daily.    . Omega-3 Fatty Acids (FISH OIL) 1000 MG CAPS Take 2 capsules by mouth daily.     No current facility-administered  medications on file prior to visit.   Allergies  Allergen Reactions  . Other     IV CONTRAST   History   Social History  . Marital Status: Married    Spouse Name: [name]  . Number of Children: 1  . Years of Education: 12   Occupational History  .  [place] History Main Topics  . Smoking status: Current Every Day Smoker -- 1.00 packs/day    Types: Cigarettes  . Smokeless tobacco: Never Used  . Alcohol Use: No  . Drug Use: No  . Sexual Activity: Not on file   Other Topics Concern  . Not on file   Social History Narrative   Patient is married [name]) and lives at home with her husband.   Patient has one adult child.   Patient is working full-time.   Patient has a high school education.   Patient is left-handed.   Patient drinks a 2 liter per day.   Family History  Problem Relation Age of Onset  . Thyroid disease Maternal Grandmother   . Parkinson's disease Mother   . Lymphoma Maternal Grandmother   . Heart Problems Father      Review of Systems  All other systems reviewed and are negative.      Objective:  Physical Exam  Constitutional: She is oriented to person, place, and time. She appears well-developed and well-nourished. No distress.  HENT:  Head: Normocephalic and atraumatic.  Right Ear: External ear normal.  Left Ear: External ear normal.  Nose: Nose normal.  Mouth/Throat: Oropharynx is clear and moist. No oropharyngeal exudate.  Eyes: Conjunctivae and EOM are normal. Pupils are equal, round, and reactive to light. Right eye exhibits no discharge. Left eye exhibits no discharge. No scleral icterus.  Neck: Normal range of motion. Neck supple. No JVD present. No tracheal deviation present. No thyromegaly present.  Cardiovascular: Normal rate, regular rhythm and intact distal pulses.  Exam reveals no gallop and no friction rub.   Murmur heard. Pulmonary/Chest: Effort normal and breath sounds normal. No stridor. No respiratory  distress. She has no wheezes. She has no rales. She exhibits no tenderness.  Abdominal: Soft. Bowel sounds are normal. She exhibits no distension and no mass. There is no tenderness. There is no rebound and no guarding.  Musculoskeletal: Normal range of motion. She exhibits no edema or tenderness.  Lymphadenopathy:    She has no cervical adenopathy.  Neurological: She is alert and oriented to person, place, and time. She has normal reflexes. She displays normal reflexes. No cranial nerve deficit. She exhibits normal muscle tone. Coordination normal.  Skin: Skin is warm. No rash noted. She is not diaphoretic. No erythema. No pallor.  Psychiatric: She has a normal mood and affect. Her behavior is normal. Judgment and thought content normal.  Vitals reviewed.  patient has +4 tonsils that literally touch the uvula in the center providing a very small airway they're likely contributing to her obstructive sleep apnea Appointment on 04/22/2014  Component Date Value Ref Range Status  . Hgb A1c MFr Bld 04/22/2014 5.9* <5.7 % Final   Comment:                                                                        According to the ADA Clinical Practice Recommendations for 2011, when HbA1c is used as a screening test:     >=6.5%   Diagnostic of Diabetes Mellitus            (if abnormal result is confirmed)   5.7-6.4%   Increased risk of developing Diabetes Mellitus   References:Diagnosis and Classification of Diabetes Mellitus,Diabetes JOIN,8676,72(CNOBS 1):S62-S69 and Standards of Medical Care in         Diabetes - 2011,Diabetes JGGE,3662,94 (Suppl 1):S11-S61.     . Mean Plasma Glucose 04/22/2014 123* <117 mg/dL Final  . WBC 04/22/2014 5.6  4.0 - 10.5 K/uL Final  . RBC 04/22/2014 4.63  3.87 - 5.11 MIL/uL Final  . Hemoglobin 04/22/2014 13.7  12.0 - 15.0 g/dL Final  . HCT 04/22/2014 41.7  36.0 - 46.0 % Final  . MCV 04/22/2014 90.1  78.0 - 100.0 fL Final  . MCH 04/22/2014 29.6  26.0 - 34.0 pg Final   . MCHC 04/22/2014 32.9  30.0 - 36.0 g/dL Final  . RDW 04/22/2014 15.8* 11.5 - 15.5 % Final  . Platelets 04/22/2014 246  150 - 400 K/uL Final  . MPV 04/22/2014 9.2  8.6 - 12.4 fL Final  . Neutrophils Relative % 04/22/2014 43  43 -  77 % Final  . Neutro Abs 04/22/2014 2.4  1.7 - 7.7 K/uL Final  . Lymphocytes Relative 04/22/2014 49* 12 - 46 % Final  . Lymphs Abs 04/22/2014 2.7  0.7 - 4.0 K/uL Final  . Monocytes Relative 04/22/2014 6  3 - 12 % Final  . Monocytes Absolute 04/22/2014 0.3  0.1 - 1.0 K/uL Final  . Eosinophils Relative 04/22/2014 2  0 - 5 % Final  . Eosinophils Absolute 04/22/2014 0.1  0.0 - 0.7 K/uL Final  . Basophils Relative 04/22/2014 0  0 - 1 % Final  . Basophils Absolute 04/22/2014 0.0  0.0 - 0.1 K/uL Final  . Smear Review 04/22/2014 Criteria for review not met   Final  . Cholesterol 04/22/2014 170  0 - 200 mg/dL Final   Comment: ATP III Classification:       < 200        mg/dL        Desirable      200 - 239     mg/dL        Borderline High      >= 240        mg/dL        High     . Triglycerides 04/22/2014 263* <150 mg/dL Final  . HDL 04/22/2014 25* >=46 mg/dL Final   ** Please note change in reference range(s). **  . Total CHOL/HDL Ratio 04/22/2014 6.8   Final  . VLDL 04/22/2014 53* 0 - 40 mg/dL Final  . LDL Cholesterol 04/22/2014 92  0 - 99 mg/dL Final   Comment:   Total Cholesterol/HDL Ratio:CHD Risk                        Coronary Heart Disease Risk Table                                        Men       Women          1/2 Average Risk              3.4        3.3              Average Risk              5.0        4.4           2X Average Risk              9.6        7.1           3X Average Risk             23.4       11.0 Use the calculated Patient Ratio above and the CHD Risk table  to determine the patient's CHD Risk. ATP III Classification (LDL):       < 100        mg/dL         Optimal      100 - 129     mg/dL         Near or Above Optimal      130 -  159     mg/dL         Borderline High      160 - 189  mg/dL         High       > 190        mg/dL         Very High     . TSH 04/22/2014 2.511  0.350 - 4.500 uIU/mL Final  . Sodium 04/22/2014 137  135 - 145 mEq/L Final  . Potassium 04/22/2014 4.3  3.5 - 5.3 mEq/L Final  . Chloride 04/22/2014 104  96 - 112 mEq/L Final  . CO2 04/22/2014 21  19 - 32 mEq/L Final  . Glucose, Bld 04/22/2014 86  70 - 99 mg/dL Final  . BUN 04/22/2014 16  6 - 23 mg/dL Final  . Creat 04/22/2014 0.54  0.50 - 1.10 mg/dL Final  . Total Bilirubin 04/22/2014 0.4  0.2 - 1.2 mg/dL Final  . Alkaline Phosphatase 04/22/2014 63  39 - 117 U/L Final  . AST 04/22/2014 19  0 - 37 U/L Final  . ALT 04/22/2014 22  0 - 35 U/L Final  . Total Protein 04/22/2014 6.9  6.0 - 8.3 g/dL Final  . Albumin 04/22/2014 4.0  3.5 - 5.2 g/dL Final  . Calcium 04/22/2014 9.3  8.4 - 10.5 mg/dL Final  . GFR, Est African American 04/22/2014 >89   Final  . GFR, Est Non African American 04/22/2014 >89   Final   Comment:   The estimated GFR is a calculation valid for adults (>=91 years old) that uses the CKD-EPI algorithm to adjust for age and sex. It is   not to be used for children, pregnant women, hospitalized patients,    patients on dialysis, or with rapidly changing kidney function. According to the NKDEP, eGFR >89 is normal, 60-89 shows mild impairment, 30-59 shows moderate impairment, 15-29 shows severe impairment and <15 is ESRD.             Assessment & Plan:  Routine general medical examination at a health care facility - Plan: MM Digital Screening  GAD (generalized anxiety disorder) - Plan: DULoxetine (CYMBALTA) 60 MG capsule  Screening mammogram for high-risk patient - Plan: MM Digital Screening  Heart murmur - Plan: 2D Echocardiogram without contrast  Large tonsils - Plan: Ambulatory referral to ENT  discontinue Celexa and replaced with Cymbalta 60 mg by mouth daily. I will schedule the patient for mammogram. Given the  new murmur found on exam I will also schedule the patient for an echocardiogram to evaluate further all I believe this is likely aortic stenosis. I will also refer the patient to an ENT physician as she is interested in possibly having a tonsillectomy to help her obstructive sleep apnea. Lab work is significant for dyslipidemia with a very low HDL cholesterol. I gave the patient option of Niaspan versus vigorous aerobic exercise and weight loss. The patient would like to pursue exercise and weight loss. I would like to recheck the patient in 6 months and see 20 pounds weight loss at that time.

## 2014-04-29 ENCOUNTER — Ambulatory Visit (HOSPITAL_COMMUNITY): Payer: PRIVATE HEALTH INSURANCE | Attending: Cardiovascular Disease | Admitting: Cardiology

## 2014-04-29 DIAGNOSIS — R011 Cardiac murmur, unspecified: Secondary | ICD-10-CM | POA: Diagnosis not present

## 2014-04-29 NOTE — Progress Notes (Signed)
Echo performed. 

## 2014-05-02 LAB — PAP, THIN PREP W/HPV RFLX HPV TYPE 16/18: HPV DNA High Risk: NOT DETECTED

## 2014-05-16 NOTE — Telephone Encounter (Signed)
Pt aware of Echo results. That were normal

## 2014-05-30 ENCOUNTER — Telehealth: Payer: Self-pay | Admitting: Family Medicine

## 2014-05-30 NOTE — Telephone Encounter (Signed)
[place]  PT is needing a refill on her xanax

## 2014-05-30 NOTE — Telephone Encounter (Signed)
?   OK to Refill  

## 2014-05-31 MED ORDER — ALPRAZOLAM 0.5 MG PO TABS: 0.5000 mg | ORAL_TABLET | Freq: Three times a day (TID) | ORAL | Status: DC | PRN

## 2014-05-31 NOTE — Telephone Encounter (Signed)
Medication called/sent to requested pharmacy  

## 2014-05-31 NOTE — Telephone Encounter (Signed)
ok 

## 2014-06-10 ENCOUNTER — Encounter: Payer: Self-pay | Admitting: Family Medicine

## 2014-08-15 ENCOUNTER — Encounter (HOSPITAL_COMMUNITY): Payer: Self-pay

## 2014-08-15 ENCOUNTER — Encounter (HOSPITAL_COMMUNITY)
Admission: RE | Admit: 2014-08-15 | Discharge: 2014-08-15 | Disposition: A | Discharge status: Home / Self Care | Payer: PRIVATE HEALTH INSURANCE | Source: Ambulatory Visit | Attending: Otolaryngology | Admitting: Otolaryngology

## 2014-08-15 DIAGNOSIS — J343 Hypertrophy of nasal turbinates: Secondary | ICD-10-CM | POA: Insufficient documentation

## 2014-08-15 DIAGNOSIS — Z79899 Other long term (current) drug therapy: Secondary | ICD-10-CM | POA: Diagnosis not present

## 2014-08-15 DIAGNOSIS — I4510 Unspecified right bundle-branch block: Secondary | ICD-10-CM | POA: Diagnosis not present

## 2014-08-15 DIAGNOSIS — I10 Essential (primary) hypertension: Secondary | ICD-10-CM | POA: Diagnosis not present

## 2014-08-15 DIAGNOSIS — Z01818 Encounter for other preprocedural examination: Secondary | ICD-10-CM | POA: Insufficient documentation

## 2014-08-15 DIAGNOSIS — J342 Deviated nasal septum: Secondary | ICD-10-CM | POA: Diagnosis not present

## 2014-08-15 DIAGNOSIS — G4733 Obstructive sleep apnea (adult) (pediatric): Secondary | ICD-10-CM | POA: Diagnosis not present

## 2014-08-15 DIAGNOSIS — Z01812 Encounter for preprocedural laboratory examination: Secondary | ICD-10-CM | POA: Insufficient documentation

## 2014-08-15 DIAGNOSIS — Z8543 Personal history of malignant neoplasm of ovary: Secondary | ICD-10-CM | POA: Insufficient documentation

## 2014-08-15 DIAGNOSIS — E039 Hypothyroidism, unspecified: Secondary | ICD-10-CM | POA: Diagnosis not present

## 2014-08-15 HISTORY — DX: Cough: R05

## 2014-08-15 HISTORY — DX: Hypothyroidism, unspecified: E03.9

## 2014-08-15 HISTORY — DX: Cough, unspecified: R05.9

## 2014-08-15 LAB — BASIC METABOLIC PANEL
Anion gap: 8 (ref 5–15)
BUN: 15 mg/dL (ref 6–20)
CALCIUM: 9.7 mg/dL (ref 8.9–10.3)
CO2: 24 mmol/L (ref 22–32)
CREATININE: 0.57 mg/dL (ref 0.44–1.00)
Chloride: 105 mmol/L (ref 101–111)
GFR calc Af Amer: >60 mL/min (ref >60)
GFR calc non Af Amer: >60 mL/min (ref >60)
Glucose, Bld: 94 mg/dL (ref 65–99)
Potassium: 4.7 mmol/L (ref 3.5–5.1)
Sodium: 137 mmol/L (ref 135–145)

## 2014-08-15 LAB — CBC
HCT: 42 % (ref 36.0–46.0)
Hemoglobin: 14.4 g/dL (ref 12.0–15.0)
MCH: 30.8 pg (ref 26.0–34.0)
MCHC: 34.3 g/dL (ref 30.0–36.0)
MCV: 89.7 fL (ref 78.0–100.0)
Platelets: 231 10*3/uL (ref 150–400)
RBC: 4.68 MIL/uL (ref 3.87–5.11)
RDW: 15.3 % (ref 11.5–15.5)
WBC: 6.3 10*3/uL (ref 4.0–10.5)

## 2014-08-15 NOTE — Pre-Procedure Instructions (Signed)
    [name] [name]  08/15/2014      [facility] - [place], [place] - [address] [place] [address] Phone: [phone] Fax: [phone]  [facility] [address], [place]  [address] Phone: [phone] Fax: [phone]    Your procedure is scheduled on 08/24/14.  Report to [facility] Admitting at 8 A.M.  Call this number if you have problems the morning of surgery:  [phone]   Remember:  Do not eat food or drink liquids after midnight.  Take these medicines the morning of surgery with A SIP OF WATER -xanax,cymbalta,synthroid   Do not wear jewelry, make-up or nail polish.  Do not wear lotions, powders, or perfumes.  You may wear deodorant.  Do not shave 48 hours prior to surgery.  Men may shave face and neck.  Do not bring valuables to the hospital.  [facility] is not responsible for any belongings or valuables.  Contacts, dentures or bridgework may not be worn into surgery.  Leave your suitcase in the car.  After surgery it may be brought to your room.  For patients admitted to the hospital, discharge time will be determined by your treatment team.  Patients discharged the day of surgery will not be allowed to drive home.   Name and phone number of your driver:    Special instructions:    Please read over the following fact sheets that you were given. Pain Booklet, Coughing and Deep Breathing and Surgical Site Infection Prevention

## 2014-08-16 ENCOUNTER — Other Ambulatory Visit: Payer: Self-pay | Admitting: Otolaryngology

## 2014-08-16 NOTE — Progress Notes (Signed)
Anesthesia Chart Review: Patient is a 49 year old female posted (ordrs pending) for septoplasty, turbinate reduction and uvulectomy, tonsillectomy on 5/99/35 by Dr. Erik Obey.   History includes smoking, HTN, ovarian cancer s/p hysterectomy '03, anxiety, depression, OSA on CPAP, hypothyroidism, nephrolithiasis. BMI is consistent with morbid obesity. PCP is Dr. Jenna Luo. He ordered recent echo due to finding of murmur (no significant valvular disease noted). Neurologist is Dr. Brett Fairy (for OSA).   Meds include Xanax, Cymbalta, fenofibrate, levothyroxine, fish oil.  08/15/14 EKG: NSR, rightward axis, incomplete right BBB. Overall, I think her EKG is stable since at least 11/24/12.  04/29/14 Echo:  Study Conclusions - Left ventricle: The cavity size was normal. Wall thickness was increased in a pattern of moderate LVH. Systolic function was normal. The estimated ejection fraction was in the range of 60% to 65%. Wall motion was normal; there were no regional wall motion abnormalities. - Atrial septum: No defect or patent foramen ovale was identified.  Preoperative labs noted.   If no acute changes I would anticipate that she could proceed as planned.  George Hugh [facility] Short Stay [facility]/Anesthesiology Phone [phone] 08/16/2014 2:23 PM

## 2014-08-16 NOTE — H&P (Signed)
[name] [name], [name] [name] 49 y.o., female [id number]     Chief Complaint: nasal obstruction.  Obstructive sleep apnea.   HPI: Patient is a 49 year old female who presents for enlarged tonsils and mouth breathing. She has a history of severe obstructive sleep apnea. She uses a CPAP every night. Her last sleep study was at [facility] performed 02/2011.  For years she states she has always been breathing through her mouth. She did try a topical steroid nasal spray for two weeks but did not see any benefit. She is not aware of nasal obstruction and denies a history of recurrent sinusitis or allergies. She denies a history of recurrent tonsillitis, cryptic tonsillitis or acid reflux. She also reports dysphagia in recent months; is becoming choked on foods but also on liquids. The ears have been itching as well; she does not use Q-tips regularly. She is hypothyroid, takes replacement hormone. Recent bloodwork was normal. To her knowledge, no history of HIV. She recently had an echocardiogram for a mild heart murmur detected on exam. She is not sure what the results were. No PMH of bleeding disorder, heart disease, COPD, asthma or adverse reaction to anesthesia. She is a former smoker (quit ten days ago).  I finally was able to speak with her.  I explained that her sleep study in 2009, at which time she weighted 225, showed an apnea hypopnea index of 110 per hour.  She did have a CPAP titration trial in 2013.    she now weighs 240.  She did not feel like the Afrin gave her very much help at all.  I explained to her that I thought opening up her nose surgically would be appealing both day  and night.  It might allow for a lower CPAP pressure.  I think tonsillectomy might help with her sense of breathing during the daytime, and with her swallowing complaints.  This is a much more painful operation than just the nose operation.  I do not think we are likely to get her off her CPAP machine completely and  permanently.  She would like to proceed.  We will see her back preoperatively.  Preoperative visit.  We did retrieve her earlier sleep study showing an apnea hypopnea index of 110 per hour with oxygen desaturation to 46%.  She did not feel like Afrin spray made much difference in her snoring.  She would like to pursue septoplasty and reduction of turbinates, and also tonsillectomy.  Upon discussion, she also has a long thick uvula although her soft palate is not terribly long.  I would recommend as long as we are going to put her through the recuperation from tonsillectomy, that we do a uvulectomy as well.  I discussed the several operations including risks and complications.  Questions were answered and informed consent was obtained.  Postoperative prescriptions for oxycodone liquid, cephalexin liquids, and ondansetron tablets written and given.  I discussed advancement of diet and activity including return to work.  We gave her tonsillectomy and nasal hygiene instructions.  I will remove her nasal packing on the morning of postoperative day 1 while she is still the hospital.  I will see her back here 9-10 days thereafter for removal of septal splints  PMH: Past Medical History  Diagnosis Date  . Thyroid disease   . Hypertension   . Pneumonia   . Ovarian cancer   . Anxiety   . Depression   . Kidney stone   . Sleep apnea   .  Tonsillar and adenoid hypertrophy   . OSA on CPAP   . Nicotine dependence, cigarettes, with other nicotine-induced disorders   . Nocturia   . Hypothyroidism   . Cough     Surg Hx: Past Surgical History  Procedure Laterality Date  . Abdominal hysterectomy  2003  . Tubal ligation    . Hemorrhoid surgery  2007    FHx:   Family History  Problem Relation Age of Onset  . Thyroid disease Maternal Grandmother   . Parkinson's disease Mother   . Lymphoma Maternal Grandmother   . Heart Problems Father    SocHx:  reports that she has been smoking Cigarettes.  She has a  17 pack-year smoking history. She has never used smokeless tobacco. She reports that she does not drink alcohol or use illicit drugs.  ALLERGIES:  Allergies  Allergen Reactions  . Other Other (See Comments)    IV CONTRAST caused crazy thoughts and speech     (Not in a hospital admission)  Results for orders placed or performed during the hospital encounter of 08/15/14 (from the past 48 hour(s))  CBC     Status: None   Collection Time: 08/15/14 11:20 AM  Result Value Ref Range   WBC 6.3 4.0 - 10.5 K/uL   RBC 4.68 3.87 - 5.11 MIL/uL   Hemoglobin 14.4 12.0 - 15.0 g/dL   HCT 42.0 36.0 - 46.0 %   MCV 89.7 78.0 - 100.0 fL   MCH 30.8 26.0 - 34.0 pg   MCHC 34.3 30.0 - 36.0 g/dL   RDW 15.3 11.5 - 15.5 %   Platelets 231 150 - 400 K/uL  Basic metabolic panel     Status: None   Collection Time: 08/15/14 11:20 AM  Result Value Ref Range   Sodium 137 135 - 145 mmol/L   Potassium 4.7 3.5 - 5.1 mmol/L   Chloride 105 101 - 111 mmol/L   CO2 24 22 - 32 mmol/L   Glucose, Bld 94 65 - 99 mg/dL   BUN 15 6 - 20 mg/dL   Creatinine, Ser 0.57 0.44 - 1.00 mg/dL   Calcium 9.7 8.9 - 10.3 mg/dL   GFR calc non Af Amer >60 >60 mL/min   GFR calc Af Amer >60 >60 mL/min    Comment: (NOTE) The eGFR has been calculated using the CKD EPI equation. This calculation has not been validated in all clinical situations. eGFR's persistently <60 mL/min signify possible Chronic Kidney Disease.    Anion gap 8 5 - 15   No results found.  ROS:  Systemic: Feeling tired (fatigue).  No fever, no night sweats, and no recent weight loss. Head: No headache. Eyes: No eye symptoms. Otolaryngeal: No hearing loss, no earache, no tinnitus, and no purulent nasal discharge.  No nasal passage blockage (stuffiness).  Snoring.  No sneezing.  Hoarseness.  No sore throat. Cardiovascular: No chest pain or discomfort  and no palpitations. Pulmonary: No dyspnea, no cough, and no wheezing. Gastrointestinal: Dysphagia.  No heartburn.   No nausea, no abdominal pain, and no melena.  No diarrhea. Genitourinary: No dysuria. Endocrine: No muscle weakness. Musculoskeletal: No calf muscle cramps, no arthralgias, and no soft tissue swelling. Neurological: No dizziness, no fainting, no tingling, and no numbness. Psychological: No anxiety  and no depression. Skin: No rash.  BP:141/93,  Height: 5 ft 3 in, Weight: 240 lb , BMI: 42.5 kg/m2,    PHYSICAL EXAM: She is pleasant and overall healthy.  She is moderately heavyset.  Mental  status is appropriate.  She hears well in conversational speech.  Voice is clear and respirations unlabored through the nose and mouth.  The head is atraumatic and neck supple.  Cranial nerves intact.  Ear canals are clear with normal drums.  Anterior nose shows some septal corrugation and bulky turbinates.  Oral cavity is clear with teeth in good repair.  Oropharynx shows 3+ tonsils with a long thick central uvula.  No velopharyngeal insufficiency.  Neck unremarkable.   Lungs: Clear to auscultation Heart: Regular rate and rhythm without murmurs Abdomen: Soft, active Extremities: Normal configuration Neurological and symmetric, grossly intact.    Assessment/Plan Hypertrophy of tonsil (474.11) (J35.1). Nasal septal deviation (470) (J34.2). OSA on CPAP (327.23) (G47.33). Obesity (BMI 30-39.9) (278.00) (E66.9).  We are going to straighten your nasal septum, reduce the turbinates, remove your very large tonsils, and also shorten the uvula.  This will take in the range of 2 hours.  You will stay overnight one night in the hospital with us.  I am sending him prescriptions for antibiotic and for antinausea tablets.  I am going to hand you a prescription for oxycodone liquid narcotic pain medication.    I will see you back here 10 days after surgery.   See our post tonsillectomy instructions, and also nasal hygiene instructions.  Cephalexin 250 MG/5ML Oral Suspension Reconstituted;10 ml po q6h to prevent  infection; Qty400; R0; Rx. Ondansetron 4 MG Oral Tablet Dispersible;1 po q6h prn N,V; Qty8; R2; Rx. OxyCODONE HCl - 5 MG/5ML Oral Solution;5-10 ml po q4h prn severe pain; Qty400; R0; Rx.  WOLICKI, KAROL 08/16/2014, 5:45 PM     

## 2014-08-24 ENCOUNTER — Observation Stay (HOSPITAL_COMMUNITY)
Admission: RE | Admit: 2014-08-24 | Discharge: 2014-08-25 | Disposition: A | Discharge status: Home / Self Care | Payer: PRIVATE HEALTH INSURANCE | Source: Ambulatory Visit | Attending: Otolaryngology | Admitting: Otolaryngology

## 2014-08-24 ENCOUNTER — Encounter (HOSPITAL_COMMUNITY)
Admission: RE | Disposition: A | Discharge status: Home / Self Care | Payer: Self-pay | Source: Ambulatory Visit | Attending: Otolaryngology

## 2014-08-24 ENCOUNTER — Ambulatory Visit (HOSPITAL_COMMUNITY): Payer: PRIVATE HEALTH INSURANCE | Admitting: Vascular Surgery

## 2014-08-24 ENCOUNTER — Ambulatory Visit (HOSPITAL_COMMUNITY): Payer: PRIVATE HEALTH INSURANCE | Admitting: Certified Registered Nurse Anesthetist

## 2014-08-24 DIAGNOSIS — J343 Hypertrophy of nasal turbinates: Secondary | ICD-10-CM | POA: Diagnosis not present

## 2014-08-24 DIAGNOSIS — G4733 Obstructive sleep apnea (adult) (pediatric): Secondary | ICD-10-CM | POA: Diagnosis not present

## 2014-08-24 DIAGNOSIS — Z9989 Dependence on other enabling machines and devices: Secondary | ICD-10-CM | POA: Diagnosis not present

## 2014-08-24 DIAGNOSIS — I10 Essential (primary) hypertension: Secondary | ICD-10-CM | POA: Insufficient documentation

## 2014-08-24 DIAGNOSIS — E039 Hypothyroidism, unspecified: Secondary | ICD-10-CM | POA: Diagnosis not present

## 2014-08-24 DIAGNOSIS — Z8543 Personal history of malignant neoplasm of ovary: Secondary | ICD-10-CM | POA: Insufficient documentation

## 2014-08-24 DIAGNOSIS — F329 Major depressive disorder, single episode, unspecified: Secondary | ICD-10-CM | POA: Insufficient documentation

## 2014-08-24 DIAGNOSIS — F419 Anxiety disorder, unspecified: Secondary | ICD-10-CM | POA: Diagnosis not present

## 2014-08-24 DIAGNOSIS — Z91041 Radiographic dye allergy status: Secondary | ICD-10-CM | POA: Diagnosis not present

## 2014-08-24 DIAGNOSIS — J351 Hypertrophy of tonsils: Secondary | ICD-10-CM | POA: Insufficient documentation

## 2014-08-24 DIAGNOSIS — Z6841 Body Mass Index (BMI) 40.0 and over, adult: Secondary | ICD-10-CM | POA: Diagnosis not present

## 2014-08-24 DIAGNOSIS — Q386 Other congenital malformations of mouth: Secondary | ICD-10-CM | POA: Diagnosis not present

## 2014-08-24 DIAGNOSIS — J342 Deviated nasal septum: Secondary | ICD-10-CM | POA: Insufficient documentation

## 2014-08-24 DIAGNOSIS — F17210 Nicotine dependence, cigarettes, uncomplicated: Secondary | ICD-10-CM | POA: Diagnosis not present

## 2014-08-24 DIAGNOSIS — R1310 Dysphagia, unspecified: Secondary | ICD-10-CM | POA: Insufficient documentation

## 2014-08-24 HISTORY — PX: SEPTOPLASTY: SHX2393

## 2014-08-24 HISTORY — PX: TURBINATE REDUCTION: SHX6157

## 2014-08-24 HISTORY — PX: TONSILLECTOMY: SHX5217

## 2014-08-24 SURGERY — SEPTOPLASTY, NOSE
Anesthesia: General | Site: Nose

## 2014-08-24 MED ORDER — HYDROMORPHONE HCL 1 MG/ML IJ SOLN
INTRAMUSCULAR | Status: AC
  Administered 2014-08-24: 0.5 mg via INTRAVENOUS
  Filled 2014-08-24: qty 1

## 2014-08-24 MED ORDER — SUGAMMADEX SODIUM 200 MG/2ML IV SOLN
INTRAVENOUS | Status: AC
  Filled 2014-08-24: qty 2

## 2014-08-24 MED ORDER — BACITRACIN ZINC 500 UNIT/GM EX OINT
TOPICAL_OINTMENT | CUTANEOUS | Status: DC | PRN
  Administered 2014-08-24: 1 via TOPICAL

## 2014-08-24 MED ORDER — SUCCINYLCHOLINE CHLORIDE 20 MG/ML IJ SOLN
INTRAMUSCULAR | Status: DC | PRN
  Administered 2014-08-24: 120 mg via INTRAVENOUS

## 2014-08-24 MED ORDER — PHENYLEPHRINE HCL 10 MG/ML IJ SOLN
INTRAMUSCULAR | Status: DC | PRN
  Administered 2014-08-24 (×5): 80 ug via INTRAVENOUS

## 2014-08-24 MED ORDER — BACITRACIN ZINC 500 UNIT/GM EX OINT
TOPICAL_OINTMENT | CUTANEOUS | Status: AC
  Filled 2014-08-24: qty 28.35

## 2014-08-24 MED ORDER — OXYMETAZOLINE HCL 0.05 % NA SOLN
NASAL | Status: AC
  Filled 2014-08-24: qty 15

## 2014-08-24 MED ORDER — IBUPROFEN 100 MG/5ML PO SUSP
400.0000 mg | Freq: Four times a day (QID) | ORAL | Status: DC | PRN
  Filled 2014-08-24: qty 20

## 2014-08-24 MED ORDER — HYDROMORPHONE HCL 1 MG/ML IJ SOLN
0.2500 mg | INTRAMUSCULAR | Status: DC | PRN
  Administered 2014-08-24: 0.5 mg via INTRAVENOUS

## 2014-08-24 MED ORDER — ROCURONIUM BROMIDE 100 MG/10ML IV SOLN
INTRAVENOUS | Status: DC | PRN
  Administered 2014-08-24: 30 mg via INTRAVENOUS
  Administered 2014-08-24: 20 mg via INTRAVENOUS

## 2014-08-24 MED ORDER — MIDAZOLAM HCL 5 MG/5ML IJ SOLN
INTRAMUSCULAR | Status: DC | PRN
  Administered 2014-08-24 (×2): 1 mg via INTRAVENOUS

## 2014-08-24 MED ORDER — LIDOCAINE-EPINEPHRINE 1 %-1:100000 IJ SOLN: INTRAMUSCULAR | Status: DC | PRN

## 2014-08-24 MED ORDER — PROPOFOL 10 MG/ML IV BOLUS
INTRAVENOUS | Status: DC | PRN
  Administered 2014-08-24: 30 mg via INTRAVENOUS
  Administered 2014-08-24: 200 mg via INTRAVENOUS
  Administered 2014-08-24: 50 mg via INTRAVENOUS

## 2014-08-24 MED ORDER — ONDANSETRON HCL 4 MG/2ML IJ SOLN
INTRAMUSCULAR | Status: AC
  Filled 2014-08-24: qty 2

## 2014-08-24 MED ORDER — DULOXETINE HCL 60 MG PO CPEP
60.0000 mg | ORAL_CAPSULE | Freq: Every day | ORAL | Status: DC
  Administered 2014-08-25: 60 mg via ORAL
  Filled 2014-08-24: qty 1

## 2014-08-24 MED ORDER — SUFENTANIL CITRATE 50 MCG/ML IV SOLN
INTRAVENOUS | Status: AC
  Filled 2014-08-24: qty 1

## 2014-08-24 MED ORDER — LIDOCAINE-EPINEPHRINE 2 %-1:100000 IJ SOLN
INTRAMUSCULAR | Status: DC | PRN
  Administered 2014-08-24: 18 mL

## 2014-08-24 MED ORDER — EPHEDRINE SULFATE 50 MG/ML IJ SOLN
INTRAMUSCULAR | Status: DC | PRN
  Administered 2014-08-24 (×4): 10 mg via INTRAVENOUS

## 2014-08-24 MED ORDER — GLYCOPYRROLATE 0.2 MG/ML IJ SOLN
INTRAMUSCULAR | Status: AC
  Filled 2014-08-24: qty 1

## 2014-08-24 MED ORDER — DEXTROSE-NACL 5-0.45 % IV SOLN
INTRAVENOUS | Status: DC
Start: 2014-08-24 — End: 2014-08-25
  Administered 2014-08-24: 125 mL via INTRAVENOUS
  Administered 2014-08-25: 03:00:00 via INTRAVENOUS

## 2014-08-24 MED ORDER — LIDOCAINE-EPINEPHRINE 2 %-1:100000 IJ SOLN
INTRAMUSCULAR | Status: AC
  Filled 2014-08-24: qty 1

## 2014-08-24 MED ORDER — LIDOCAINE HCL (CARDIAC) 20 MG/ML IV SOLN
INTRAVENOUS | Status: DC | PRN
  Administered 2014-08-24: 80 mg via INTRAVENOUS

## 2014-08-24 MED ORDER — LACTATED RINGERS IV SOLN
INTRAVENOUS | Status: DC
  Administered 2014-08-24 (×2): via INTRAVENOUS

## 2014-08-24 MED ORDER — EPHEDRINE SULFATE 50 MG/ML IJ SOLN
INTRAMUSCULAR | Status: AC
  Filled 2014-08-24: qty 1

## 2014-08-24 MED ORDER — ONDANSETRON HCL 4 MG/2ML IJ SOLN: 4.0000 mg | INTRAMUSCULAR | Status: DC | PRN

## 2014-08-24 MED ORDER — ONDANSETRON HCL 4 MG PO TABS: 4.0000 mg | ORAL_TABLET | ORAL | Status: DC | PRN

## 2014-08-24 MED ORDER — SUGAMMADEX SODIUM 200 MG/2ML IV SOLN
INTRAVENOUS | Status: DC | PRN
  Administered 2014-08-24: 200 mg via INTRAVENOUS

## 2014-08-24 MED ORDER — LIDOCAINE HCL (CARDIAC) 20 MG/ML IV SOLN
INTRAVENOUS | Status: AC
  Filled 2014-08-24: qty 5

## 2014-08-24 MED ORDER — OXYMETAZOLINE HCL 0.05 % NA SOLN
2.0000 | NASAL | Status: DC
  Administered 2014-08-24: 2 via NASAL
  Filled 2014-08-24: qty 15

## 2014-08-24 MED ORDER — PHENYLEPHRINE HCL 10 MG/ML IJ SOLN
10.0000 mg | INTRAMUSCULAR | Status: DC | PRN
Start: 2014-08-24 — End: 2014-08-24
  Administered 2014-08-24: 50 ug/min via INTRAVENOUS

## 2014-08-24 MED ORDER — CEFAZOLIN SODIUM-DEXTROSE 2-3 GM-% IV SOLR
2.0000 g | INTRAVENOUS | Status: AC
  Administered 2014-08-24: 2 g via INTRAVENOUS
  Filled 2014-08-24: qty 50

## 2014-08-24 MED ORDER — LIDOCAINE-EPINEPHRINE 0.5 %-1:200000 IJ SOLN
INTRAMUSCULAR | Status: AC
  Filled 2014-08-24: qty 1

## 2014-08-24 MED ORDER — OXYCODONE HCL 5 MG/5ML PO SOLN: 5.0000 mg | ORAL | Status: DC | PRN

## 2014-08-24 MED ORDER — PROPOFOL 10 MG/ML IV BOLUS
INTRAVENOUS | Status: AC
  Filled 2014-08-24: qty 20

## 2014-08-24 MED ORDER — ONDANSETRON HCL 4 MG/2ML IJ SOLN
INTRAMUSCULAR | Status: DC | PRN
  Administered 2014-08-24: 4 mg via INTRAVENOUS

## 2014-08-24 MED ORDER — SODIUM CHLORIDE 0.9 % IJ SOLN
INTRAMUSCULAR | Status: AC
  Filled 2014-08-24: qty 10

## 2014-08-24 MED ORDER — 0.9 % SODIUM CHLORIDE (POUR BTL) OPTIME
TOPICAL | Status: DC | PRN
  Administered 2014-08-24: 1000 mL

## 2014-08-24 MED ORDER — MIDAZOLAM HCL 2 MG/2ML IJ SOLN
INTRAMUSCULAR | Status: AC
  Filled 2014-08-24: qty 4

## 2014-08-24 MED ORDER — SUFENTANIL CITRATE 50 MCG/ML IV SOLN
INTRAVENOUS | Status: DC | PRN
  Administered 2014-08-24: 30 ug via INTRAVENOUS

## 2014-08-24 MED ORDER — MORPHINE SULFATE 2 MG/ML IJ SOLN
1.0000 mg | INTRAMUSCULAR | Status: DC | PRN
  Administered 2014-08-25 (×2): 2 mg via INTRAVENOUS
  Filled 2014-08-24 (×2): qty 1

## 2014-08-24 MED ORDER — CEPHALEXIN 250 MG/5ML PO SUSR
500.0000 mg | Freq: Four times a day (QID) | ORAL | Status: DC
Start: 2014-08-24 — End: 2014-08-25
  Administered 2014-08-24 – 2014-08-25 (×3): 500 mg via ORAL
  Filled 2014-08-24 (×7): qty 10

## 2014-08-24 MED ORDER — OXYMETAZOLINE HCL 0.05 % NA SOLN
NASAL | Status: DC | PRN
  Administered 2014-08-24: 1

## 2014-08-24 MED ORDER — GLYCOPYRROLATE 0.2 MG/ML IJ SOLN
INTRAMUSCULAR | Status: DC | PRN
  Administered 2014-08-24: 0.1 mg via INTRAVENOUS

## 2014-08-24 MED ORDER — DEXAMETHASONE SODIUM PHOSPHATE 10 MG/ML IJ SOLN
10.0000 mg | Freq: Once | INTRAMUSCULAR | Status: AC
Start: 2014-08-24 — End: 2014-08-24
  Administered 2014-08-24: 10 mg via INTRAVENOUS
  Filled 2014-08-24: qty 1

## 2014-08-24 MED ORDER — LIDOCAINE-EPINEPHRINE 0.5 %-1:200000 IJ SOLN
INTRAMUSCULAR | Status: DC | PRN
  Administered 2014-08-24: 15 mL

## 2014-08-24 SURGICAL SUPPLY — 77 items
ATTRACTOMAT 16X20 MAGNETIC DRP (DRAPES) ×1 IMPLANT
BALL CTTN LRG ABS STRL LF (GAUZE/BANDAGES/DRESSINGS)
BLADE SURG 15 STRL LF DISP TIS (BLADE) ×2 IMPLANT
BLADE SURG 15 STRL SS (BLADE) ×3
CANISTER SUCTION 2500CC (MISCELLANEOUS) ×3 IMPLANT
CATH ROBINSON RED A/P 10FR (CATHETERS) IMPLANT
CLEANER TIP ELECTROSURG 2X2 (MISCELLANEOUS) ×3 IMPLANT
COAGULATOR SUCT 8FR VV (MISCELLANEOUS) ×1 IMPLANT
COAGULATOR SUCT SWTCH 10FR 6 (ELECTROSURGICAL) ×3 IMPLANT
COTTONBALL LRG STERILE PKG (GAUZE/BANDAGES/DRESSINGS) ×2 IMPLANT
CRADLE DONUT ADULT HEAD (MISCELLANEOUS) ×1 IMPLANT
DECANTER SPIKE VIAL GLASS SM (MISCELLANEOUS) ×3 IMPLANT
DRAPE PROXIMA HALF (DRAPES) IMPLANT
DRESSING NASAL KENNEDY 3.5X.9 (MISCELLANEOUS) IMPLANT
DRSG NASAL KENNEDY 3.5X.9 (MISCELLANEOUS)
DRSG TELFA 3X8 NADH (GAUZE/BANDAGES/DRESSINGS) ×3 IMPLANT
ELECT COATED BLADE 2.86 ST (ELECTRODE) ×3 IMPLANT
ELECT REM PT RETURN 9FT ADLT (ELECTROSURGICAL) ×3
ELECT REM PT RETURN 9FT PED (ELECTROSURGICAL)
ELECTRODE REM PT RETRN 9FT PED (ELECTROSURGICAL) IMPLANT
ELECTRODE REM PT RTRN 9FT ADLT (ELECTROSURGICAL) ×2 IMPLANT
FILTER ARTHROSCOPY CONVERTOR (FILTER) ×4 IMPLANT
FORCEPS TISS BAYO ENTCEPS (INSTRUMENTS) ×1 IMPLANT
GAUZE PACKING FOLDED 2  STR (GAUZE/BANDAGES/DRESSINGS) ×1
GAUZE PACKING FOLDED 2 STR (GAUZE/BANDAGES/DRESSINGS) ×2 IMPLANT
GAUZE SPONGE 2X2 8PLY STRL LF (GAUZE/BANDAGES/DRESSINGS) ×2 IMPLANT
GAUZE SPONGE 4X4 16PLY XRAY LF (GAUZE/BANDAGES/DRESSINGS) ×3 IMPLANT
GLOVE ECLIPSE 8.0 STRL XLNG CF (GLOVE) ×5 IMPLANT
GLOVE SS BIOGEL STRL SZ 7.5 (GLOVE) ×2 IMPLANT
GLOVE SUPERSENSE BIOGEL SZ 7.5 (GLOVE) ×1
GLOVE SURG SS PI 7.5 STRL IVOR (GLOVE) ×1 IMPLANT
GLOVE SURG SS PI 8.0 STRL IVOR (GLOVE) ×2 IMPLANT
GOWN STRL REUS W/ TWL LRG LVL3 (GOWN DISPOSABLE) ×2 IMPLANT
GOWN STRL REUS W/ TWL XL LVL3 (GOWN DISPOSABLE) ×2 IMPLANT
GOWN STRL REUS W/TWL LRG LVL3 (GOWN DISPOSABLE) ×3
GOWN STRL REUS W/TWL XL LVL3 (GOWN DISPOSABLE) ×3
KIT BASIN OR (CUSTOM PROCEDURE TRAY) ×3 IMPLANT
KIT ROOM TURNOVER OR (KITS) ×3 IMPLANT
NDL HYPO 25GX1X1/2 BEV (NEEDLE) IMPLANT
NDL SPNL 22GX3.5 QUINCKE BK (NEEDLE) ×2 IMPLANT
NDL SPNL 25GX3.5 QUINCKE BL (NEEDLE) ×2 IMPLANT
NEEDLE 27GAX1X1/2 (NEEDLE) ×3 IMPLANT
NEEDLE HYPO 25GX1X1/2 BEV (NEEDLE) IMPLANT
NEEDLE SPNL 22GX3.5 QUINCKE BK (NEEDLE) ×3 IMPLANT
NEEDLE SPNL 25GX3.5 QUINCKE BL (NEEDLE) ×3 IMPLANT
NS IRRIG 1000ML POUR BTL (IV SOLUTION) ×3 IMPLANT
PACK SURGICAL SETUP 50X90 (CUSTOM PROCEDURE TRAY) ×2 IMPLANT
PAD ARMBOARD 7.5X6 YLW CONV (MISCELLANEOUS) ×6 IMPLANT
PAD DRESSING TELFA 3X8 NADH (GAUZE/BANDAGES/DRESSINGS) IMPLANT
PATTIES SURGICAL .5 X3 (DISPOSABLE) ×3 IMPLANT
PENCIL FOOT CONTROL (ELECTRODE) ×3 IMPLANT
SHEET SIL 040 (INSTRUMENTS) ×3 IMPLANT
SPECIMEN JAR SMALL (MISCELLANEOUS) ×4 IMPLANT
SPLINT NASAL DOYLE BI-VL (GAUZE/BANDAGES/DRESSINGS) IMPLANT
SPONGE GAUZE 2X2 STER 10/PKG (GAUZE/BANDAGES/DRESSINGS)
SPONGE TONSIL 1 RF SGL (DISPOSABLE) ×3 IMPLANT
STRIP CLOSURE SKIN 1/2X4 (GAUZE/BANDAGES/DRESSINGS) IMPLANT
SUT CHROMIC 3 0 PS 2 (SUTURE) IMPLANT
SUT CHROMIC 4 0 P 3 18 (SUTURE) ×3 IMPLANT
SUT CHROMIC 4 0 PS 2 18 (SUTURE) ×3 IMPLANT
SUT CHROMIC 5 0 P 3 (SUTURE) IMPLANT
SUT ETHILON 3 0 PS 1 (SUTURE) ×3 IMPLANT
SUT ETHILON 4 0 PS 2 18 (SUTURE) IMPLANT
SUT ETHILON 5 0 P 3 18 (SUTURE)
SUT NYLON ETHILON 5-0 P-3 1X18 (SUTURE) IMPLANT
SUT PDS AB 4-0 P3 18 (SUTURE) ×3 IMPLANT
SUT PLAIN 4 0 ~~LOC~~ 1 (SUTURE) ×1 IMPLANT
SUT SILK 2 0 FS (SUTURE) IMPLANT
SYR BULB 3OZ (MISCELLANEOUS) ×3 IMPLANT
SYR CONTROL 10ML LL (SYRINGE) ×3 IMPLANT
TOWEL OR 17X24 6PK STRL BLUE (TOWEL DISPOSABLE) ×5 IMPLANT
TRAY ENT MC OR (CUSTOM PROCEDURE TRAY) ×3 IMPLANT
TUBE CONNECTING 12X1/4 (SUCTIONS) ×3 IMPLANT
TUBE SALEM SUMP 12R W/ARV (TUBING) ×3 IMPLANT
TUBE SALEM SUMP 14F W/ARV (TUBING) IMPLANT
WATER STERILE IRR 1000ML POUR (IV SOLUTION) ×2 IMPLANT
YANKAUER SUCT BULB TIP NO VENT (SUCTIONS) ×3 IMPLANT

## 2014-08-24 NOTE — Anesthesia Postprocedure Evaluation (Signed)
  Anesthesia Post-op Note  Patient: [name] [name]  Procedure(s) Performed: Procedure(s): SEPTOPLASTY (N/A) TURBINATE REDUCTION AND UVULECTOMY (N/A) TONSILLECTOMY (N/A)  Patient Location: PACU  Anesthesia Type:General  Level of Consciousness: awake and alert   Airway and Oxygen Therapy: Patient Spontanous Breathing  Post-op Pain: Controlled  Post-op Assessment: Post-op Vital signs reviewed, Patient's Cardiovascular Status Stable and Respiratory Function Stable  Post-op Vital Signs: Reviewed  Filed Vitals:   08/24/14 1300  BP: 170/96  Pulse: 106  Temp:   Resp:     Complications: No apparent anesthesia complications

## 2014-08-24 NOTE — Anesthesia Preprocedure Evaluation (Signed)
Anesthesia Evaluation  Patient identified by MRN, date of birth, ID band Patient awake    Reviewed: Allergy & Precautions, H&P , NPO status , Patient's Chart, lab work & pertinent test results  Airway Mallampati: III  TM Distance: >3 FB Neck ROM: Full    Dental no notable dental hx. (+) Partial Lower, Partial Upper, Dental Advisory Given   Pulmonary sleep apnea and Continuous Positive Airway Pressure Ventilation , Current Smoker,  breath sounds clear to auscultation  Pulmonary exam normal       Cardiovascular hypertension, Pt. on medications Rhythm:Regular Rate:Normal     Neuro/Psych Anxiety Depression negative neurological ROS     GI/Hepatic negative GI ROS, Neg liver ROS,   Endo/Other  Hypothyroidism Morbid obesity  Renal/GU Renal disease  negative genitourinary   Musculoskeletal   Abdominal   Peds  Hematology negative hematology ROS (+)   Anesthesia Other Findings   Reproductive/Obstetrics negative OB ROS                             Anesthesia Physical Anesthesia Plan  ASA: III  Anesthesia Plan: General   Post-op Pain Management:    Induction: Intravenous  Airway Management Planned: Oral ETT  Additional Equipment:   Intra-op Plan:   Post-operative Plan: Extubation in OR  Informed Consent: I have reviewed the patients History and Physical, chart, labs and discussed the procedure including the risks, benefits and alternatives for the proposed anesthesia with the patient or authorized representative who has indicated his/her understanding and acceptance.   Dental advisory given  Plan Discussed with: CRNA  Anesthesia Plan Comments:         Anesthesia Quick Evaluation

## 2014-08-24 NOTE — Anesthesia Procedure Notes (Signed)
Procedure Name: Intubation Date/Time: 08/24/2014 10:14 AM Performed by: Melina Copa, Maragret Vanacker R Pre-anesthesia Checklist: Patient identified, Timeout performed, Emergency Drugs available, Suction available and Patient being monitored Patient Re-evaluated:Patient Re-evaluated prior to inductionOxygen Delivery Method: Circle system utilized Preoxygenation: Pre-oxygenation with 100% oxygen Intubation Type: IV induction Ventilation: Two handed mask ventilation required and Oral airway inserted - appropriate to patient size Laryngoscope Size: Mac and 3 Grade View: Grade I Tube type: Oral Tube size: 7.5 mm Number of attempts: 1 Airway Equipment and Method: Stylet and Patient positioned with wedge pillow Placement Confirmation: ETT inserted through vocal cords under direct vision,  positive ETCO2 and breath sounds checked- equal and bilateral Secured at: 22 cm Tube secured with: Tape Dental Injury: Teeth and Oropharynx as per pre-operative assessment

## 2014-08-24 NOTE — Progress Notes (Signed)
Report given to steve shaw rn as caregiver 

## 2014-08-24 NOTE — Interval H&P Note (Signed)
History and Physical Interval Note:  08/24/2014 9:59 AM  Champagne F Frew  has presented today for surgery, with the diagnosis of DEVITATED NASAL SEPTUM,HYPERTROPHIC TURBINATE OBSTUCTIVE SLEEP APNEA,HYPTROPHIC TONSILS  The various methods of treatment have been discussed with the patient and family. After consideration of risks, benefits and other options for treatment, the patient has consented to  Procedure(s): SEPTOPLASTY (N/A) TURBINATE REDUCTION AND UVULECTOMY (N/A) TONSILLECTOMY (N/A) as a surgical intervention .  The patient's history has been re-reviewed, patient re-examined, no change in status, stable for surgery.  I have re-reviewed the patient's chart and labs.  Questions were answered to the patient's satisfaction.     Jodi Marble

## 2014-08-24 NOTE — H&P (View-Only) (Signed)
[name],  [name] 49 y.o., female [id number]     Chief Complaint: nasal obstruction.  Obstructive sleep apnea.   HPI: Patient is a 49 year old female who presents for enlarged tonsils and mouth breathing. She has a history of severe obstructive sleep apnea. She uses a CPAP every night. Her last sleep study was at [facility] performed 02/2011.  For years she states she has always been breathing through her mouth. She did try a topical steroid nasal spray for two weeks but did not see any benefit. She is not aware of nasal obstruction and denies a history of recurrent sinusitis or allergies. She denies a history of recurrent tonsillitis, cryptic tonsillitis or acid reflux. She also reports dysphagia in recent months; is becoming choked on foods but also on liquids. The ears have been itching as well; she does not use Q-tips regularly. She is hypothyroid, takes replacement hormone. Recent bloodwork was normal. To her knowledge, no history of HIV. She recently had an echocardiogram for a mild heart murmur detected on exam. She is not sure what the results were. No PMH of bleeding disorder, heart disease, COPD, asthma or adverse reaction to anesthesia. She is a former smoker (quit ten days ago).  I finally was able to speak with her.  I explained that her sleep study in 2009, at which time she weighted 225, showed an apnea hypopnea index of 110 per hour.  She did have a CPAP titration trial in 2013.    she now weighs 240.  She did not feel like the Afrin gave her very much help at all.  I explained to her that I thought opening up her nose surgically would be appealing both day  and night.  It might allow for a lower CPAP pressure.  I think tonsillectomy might help with her sense of breathing during the daytime, and with her swallowing complaints.  This is a much more painful operation than just the nose operation.  I do not think we are likely to get her off her CPAP machine completely and  permanently.  She would like to proceed.  We will see her back preoperatively.  Preoperative visit.  We did retrieve her earlier sleep study showing an apnea hypopnea index of 110 per hour with oxygen desaturation to 46%.  She did not feel like Afrin spray made much difference in her snoring.  She would like to pursue septoplasty and reduction of turbinates, and also tonsillectomy.  Upon discussion, she also has a long thick uvula although her soft palate is not terribly long.  I would recommend as long as we are going to put her through the recuperation from tonsillectomy, that we do a uvulectomy as well.  I discussed the several operations including risks and complications.  Questions were answered and informed consent was obtained.  Postoperative prescriptions for oxycodone liquid, cephalexin liquids, and ondansetron tablets written and given.  I discussed advancement of diet and activity including return to work.  We gave her tonsillectomy and nasal hygiene instructions.  I will remove her nasal packing on the morning of postoperative day 1 while she is still the hospital.  I will see her back here 9-10 days thereafter for removal of septal splints  PMH: Past Medical History  Diagnosis Date  . Thyroid disease   . Hypertension   . Pneumonia   . Ovarian cancer   . Anxiety   . Depression   . Kidney stone   . Sleep apnea   .   Tonsillar and adenoid hypertrophy   . OSA on CPAP   . Nicotine dependence, cigarettes, with other nicotine-induced disorders   . Nocturia   . Hypothyroidism   . Cough     Surg Hx: Past Surgical History  Procedure Laterality Date  . Abdominal hysterectomy  2003  . Tubal ligation    . Hemorrhoid surgery  2007    FHx:   Family History  Problem Relation Age of Onset  . Thyroid disease Maternal Grandmother   . Parkinson's disease Mother   . Lymphoma Maternal Grandmother   . Heart Problems Father    SocHx:  reports that she has been smoking Cigarettes.  She has a  17 pack-year smoking history. She has never used smokeless tobacco. She reports that she does not drink alcohol or use illicit drugs.  ALLERGIES:  Allergies  Allergen Reactions  . Other Other (See Comments)    IV CONTRAST caused crazy thoughts and speech     (Not in a hospital admission)  Results for orders placed or performed during the hospital encounter of 08/15/14 (from the past 48 hour(s))  CBC     Status: None   Collection Time: 08/15/14 11:20 AM  Result Value Ref Range   WBC 6.3 4.0 - 10.5 K/uL   RBC 4.68 3.87 - 5.11 MIL/uL   Hemoglobin 14.4 12.0 - 15.0 g/dL   HCT 42.0 36.0 - 46.0 %   MCV 89.7 78.0 - 100.0 fL   MCH 30.8 26.0 - 34.0 pg   MCHC 34.3 30.0 - 36.0 g/dL   RDW 15.3 11.5 - 15.5 %   Platelets 231 150 - 400 K/uL  Basic metabolic panel     Status: None   Collection Time: 08/15/14 11:20 AM  Result Value Ref Range   Sodium 137 135 - 145 mmol/L   Potassium 4.7 3.5 - 5.1 mmol/L   Chloride 105 101 - 111 mmol/L   CO2 24 22 - 32 mmol/L   Glucose, Bld 94 65 - 99 mg/dL   BUN 15 6 - 20 mg/dL   Creatinine, Ser 0.57 0.44 - 1.00 mg/dL   Calcium 9.7 8.9 - 10.3 mg/dL   GFR calc non Af Amer >60 >60 mL/min   GFR calc Af Amer >60 >60 mL/min    Comment: (NOTE) The eGFR has been calculated using the CKD EPI equation. This calculation has not been validated in all clinical situations. eGFR's persistently <60 mL/min signify possible Chronic Kidney Disease.    Anion gap 8 5 - 15   No results found.  ROS:  Systemic: Feeling tired (fatigue).  No fever, no night sweats, and no recent weight loss. Head: No headache. Eyes: No eye symptoms. Otolaryngeal: No hearing loss, no earache, no tinnitus, and no purulent nasal discharge.  No nasal passage blockage (stuffiness).  Snoring.  No sneezing.  Hoarseness.  No sore throat. Cardiovascular: No chest pain or discomfort  and no palpitations. Pulmonary: No dyspnea, no cough, and no wheezing. Gastrointestinal: Dysphagia.  No heartburn.   No nausea, no abdominal pain, and no melena.  No diarrhea. Genitourinary: No dysuria. Endocrine: No muscle weakness. Musculoskeletal: No calf muscle cramps, no arthralgias, and no soft tissue swelling. Neurological: No dizziness, no fainting, no tingling, and no numbness. Psychological: No anxiety  and no depression. Skin: No rash.  BP:141/93,  Height: 5 ft 3 in, Weight: 240 lb , BMI: 42.5 kg/m2,    PHYSICAL EXAM: She is pleasant and overall healthy.  She is moderately heavyset.  Mental   status is appropriate.  She hears well in conversational speech.  Voice is clear and respirations unlabored through the nose and mouth.  The head is atraumatic and neck supple.  Cranial nerves intact.  Ear canals are clear with normal drums.  Anterior nose shows some septal corrugation and bulky turbinates.  Oral cavity is clear with teeth in good repair.  Oropharynx shows 3+ tonsils with a long thick central uvula.  No velopharyngeal insufficiency.  Neck unremarkable.   Lungs: Clear to auscultation Heart: Regular rate and rhythm without murmurs Abdomen: Soft, active Extremities: Normal configuration Neurological and symmetric, grossly intact.    Assessment/Plan Hypertrophy of tonsil (474.11) (J35.1). Nasal septal deviation (470) (J34.2). OSA on CPAP (327.23) (G47.33). Obesity (BMI 30-39.9) (278.00) (E66.9).  We are going to straighten your nasal septum, reduce the turbinates, remove your very large tonsils, and also shorten the uvula.  This will take in the range of 2 hours.  You will stay overnight one night in the hospital with us.  I am sending him prescriptions for antibiotic and for antinausea tablets.  I am going to hand you a prescription for oxycodone liquid narcotic pain medication.    I will see you back here 10 days after surgery.   See our post tonsillectomy instructions, and also nasal hygiene instructions.  Cephalexin 250 MG/5ML Oral Suspension Reconstituted;10 ml po q6h to prevent  infection; Qty400; R0; Rx. Ondansetron 4 MG Oral Tablet Dispersible;1 po q6h prn N,V; Qty8; R2; Rx. OxyCODONE HCl - 5 MG/5ML Oral Solution;5-10 ml po q4h prn severe pain; Qty400; R0; Rx.  [name] [name] 08/16/2014, 5:45 PM     

## 2014-08-24 NOTE — Transfer of Care (Signed)
Immediate Anesthesia Transfer of Care Note  Patient: [name] [name]  Procedure(s) Performed: Procedure(s): SEPTOPLASTY (N/A) TURBINATE REDUCTION AND UVULECTOMY (N/A) TONSILLECTOMY (N/A)  Patient Location: PACU  Anesthesia Type:General  Level of Consciousness: awake, patient cooperative and lethargic  Airway & Oxygen Therapy: Patient Spontanous Breathing and Patient connected to face mask  Post-op Assessment: Report given to RN, Post -op Vital signs reviewed and stable and Patient moving all extremities X 4  Post vital signs: Reviewed and stable  Last Vitals:  Filed Vitals:   08/24/14 0754  BP: 161/98  Pulse: 84  Temp: 36.2 C  Resp: 20    Complications: No apparent anesthesia complications

## 2014-08-24 NOTE — Op Note (Signed)
08/24/2014  12:34 PM    Marlaine Hind  [id number]   Pre-Op Dx:  Deviated Nasal Septum, Hypertrophic Inferior Turbinates, hypertrophic tonsils. Elongated uvula  Post-op Dx: Same  Proc: Nasal Septoplasty, Bilateral SMR Inferior Turbinates , tonsillectomy, partial uvulectomy  Surg:  Jodi Marble T MD  Anes:  GOT  EBL:  50 ml  Comp:  none  Findings:  Mod LEFTward septal deviation with bulky inferior turbinates.  2-3+ protruding tonsils.  Papilloma on LEFT tonsil.  Min residual adenoids  Procedure: With the patient in a comfortable supine position,  general orotracheal anesthesia was induced without difficulty.     The patient received preoperative Afrin spray for topical decongestion and vasoconstriction.  Intravenous prophylactic antibiotics were administered.  At an appropriate level, the patient was placed in a semi-sitting position.  A saline moistened throat pack was placed.  Nasal vibrissae were trimmed.  Afrin solution was applied on 0.5" x 3" cottonoids to both sides of the septal mucosa.   1% Xylocaine with 1:100,000 epinephrine, 10 cc's, was infiltrated into the anterior floor of the nose, into the nasal spine region, into the membranous columella, and finally into the submucoperichondrial plane of the septum on both sides.  Several minutes were allowed for this to take effect.  A sterile preparation and draping of the midface was accomplished in the standard fashion.  The materials were removed from the nose and observed to be intact and correct in number.  The nose was inspected with a headlight with the findings as described above.  A LEFT hemitransfixion incision was sharply executed and carried down to the caudal edge of the quadrangular cartilage and continued to a floor incision.  An opposite small floor incision was sharply executed as well.   Floor tunnels were elevated on both sides, carried posteriorly, then medially, then brought forward along the vomer and  maxillary crest.  The submucoperichondrial plane of the  LEFT septum was dissected up to the dorsum of the nose, back onto the perpendicular plate, and brought down and communicated with a floor tunnel and then forward along the maxillary crest.  The flap was generated intact.  The chondroethmoid junction was identified and opened with a Psychologist, educational.  The opposite submucoperiosteal plane of the perpendicular plate of the ethmoid  was elevated and carried down to the floor tunnel posteriorly.  The superior perpendicular plate was lysed with an open Jansen-Middleton forceps.  The inferior portion was dissected from the maxillary crest and vomer with a Cottle elevator.  The midportion was rocked free with a closed KeySpan forceps and then delivered.    The posterior inferior corner of the quadrangular cartilage was submucosally resected, including a cartilaginous tail up along the vomer.     The inferior edge of the caudal strut was removed, 2-3 mm to allow the septum to trapdoor into the midline.  The septum was freed from the upper lateral cartilages sharply on both sides.  Bleeding from a terminal branch of the angular artery was controlled with cautery in the RIGHT nasal vault.    After mobilizing the septum adequately, and straightening it in the standard fashion,  The septum was secured to the nasal spine with a figure-of-eight 4-0 PDS suture.  A good straight midline configuration of the septum with good dorsal support was generated.  The septal tunnel was suctioned clear.  Hemostasis was observed.  The flaps were laid back down.  The incisions were closed with interrupted 4-0 chromic suture.  Just prior  to completing the septoplasty, the inferior turbinates were each infiltrated with additional 1% Xylocaine with 1:100,000 epinephrine,  6 cc's total.  Upon completing the septoplasty, beginning on the RIGHT side, the inferior turbinate was inspected and infractured.  The anterior hood  of the inferior turbinate was sharply lysed just behind the nasal valve.  The medial mucosa of the inferior turbinate was incised in an  anterior upsloping fashion and a laterally based flap was developed from the turbinate bone.  Using angled turbinate scissors, turbinate bone and lateral mucosa were resected in a posterior downsloping fashion, taking much of the anterior pole and leaving most of the posterior pole.  Bony spicules were submucosally dissected and removed.  The mucosal flap was laid back down and the turbinate was outfractured.  This completed one SMR inferior turbinate.  The opposite side was performed in identical fashion. The cut mucosal edges and the bulbous posterior pole of the inferior turbinate were cauterized on both sides.   Again hemostasis was observed.  After completing both turbinate resections, 0.040" reinforced Silastic splints were fashioned, placed against the nasal septum for support, and secured thereto with a 3-0 Ethilon stitch.   Telfa packs impregnated with bacitracin ointment were placed between the septum and the inferior turbinates, one on each side, for hemostasis and support.  A 6.5 mm nasal trumpet was shortened to reach the choana and placed in the nose on each side to support the septum and to allow some airway.    At this time , the patient was turned 90 away from anesthesia and placed in Trendelenburg.  A clean preparation and draping was accomplished.  Taking care to protect lips, teeth, and endotracheal tube, the Crowe-Davis mouth gag was introduced, expanded for visualization, and suspended from the [place] stand in the standard fashion.  The findings were as described above.  Palate  retractor  and mirror were used to examine the nasopharynx with the findings as described above.     1/2% Xylocaine with 1:200,000 epinephrine, 12 cc's, was infiltrated into the peritonsillar planes on both sides, and into the root of the uvula,  for intraoperative hemostasis.   Several minutes were allowed for this to take effect.  Beginning on the  LEFT side, the tonsil was grasped and retracted medially.  The mucosa over the anterior and superior poles was coagulated and then cut down to the capsule of the tonsil.  Using the thermal forceps as a blunt dissector, the tonsil was dissected from its muscular fossa from anterior to posterior and from superior to inferior.  Fibrous bands were lysed as necessary.  Crossing vessels were coagulated as identified.  The tonsil was removed in its entirety as determined by examination of both tonsil and fossa.  A small additional quantity of cautery rendered the fossa hemostatic.    After completing the 1st tonsillectomy, the 2nd one was performed in identical fashion.  After completing both tonsillectomies and rendering the oropharynx hemostatic, the uvula was amputated at its base using the thermal forceps.  A posterior mucosal flap was brought forward and secured to the anterior palatal mucosa with a 4-0 Vicryl suture.  One additional suture of the same material was used to approximate the superior tonsillar pillars on both sides.    At this point the palate retractor was  relaxed for several minutes.  Upon reexpansion,  Hemostasis was observed.  An orogastric tube was briefly placed and a small amount of clear secretions was evacuated.  This tube was removed.  The mouth gag was relaxed and removed.  The dental status was intact.   At this point the procedure was completed.  The patient was returned to anesthesia, awakened, extubated, and transferred to recovery in stable condition.   Dispo:  OR to PACU.   Will observe overnight in stepdown unit given her known severe sleep apnea .   Plan:  Pack removal in AM.  Analgesia, antibiosis, hydration, limited activity for two weeks. Nasal and oral hygiene measures.   Remove septal splints at 10 days.  Advance diet as comfortable.  Return to school or work at 14 days.  Tyson Alias   MD.   At this point the procedure was completed.  The pharynx was suctioned free and the throat pack was removed.   The patient was returned to anesthesia, awakened, extubated, and transferred to recovery in stable condition.  Dispo:   PACU to home  Plan: Ice, elevation, narcotic analgesia, prophylactic antibiotics for the duration of indwelling nasal foreign bodies.  We will remove the nasal packing In one day, the septal splints in 10 days.  Return to work or school in 10 days, strenuous activities in two weeks.  Tyson Alias MD

## 2014-08-24 NOTE — Discharge Instructions (Signed)
Septoplasty  Septoplasty is surgery to straighten the wall that divides the nostrils inside the nose (septum). The structures that stick out from the side wall of the nose (turbinates) may also be trimmed (reduced) to help you breathe easier. Most patients go home the same day as the surgery (outpatient). LET YOUR CAREGIVER KNOW ABOUT:  Allergies to food or medicine.  Medicines taken, including vitamins, herbs, eyedrops, over-the-counter medicines, and creams.  Use of steroids (by mouth or creams).  Previous problems with anesthetics or numbing medicines.  History of bleeding problems or blood clots.  Previous surgery.  Other health problems, including diabetes and kidney problems.  Possibility of pregnancy, if this applies. RISKS AND COMPLICATIONS  Allergic reaction to the medicines used during surgery.  Bleeding.  Infection.  Loss of feeling in the top lip due to nerve damage. This may be temporary or permanent.  Scar tissue (adhesions) inside the nose. This may require further surgery.  A hole (perforation) in the septum.  Return of nasal blockage after surgery. This may require another surgery.  Impaired or lost sense of taste. This may be temporary or permanent.  Increased snoring or sleep disturbances.  Changes in the appearance of the nose (rare). BEFORE THE PROCEDURE  Do not eat or drink anything after midnight on the night before your procedure, or as directed by your caregiver.  Ask your caregiver about changing or stopping your regular medicines. PROCEDURE You will be given medicine to numb the area (local anesthetic) or medicine to make you sleep (general anesthetic). The surgeon will reshape the cartilage and bone in the septum. In some cases, part of the cartilage or bone may need to be removed. The turbinates may also be reduced. This is often done by removing part of the material inside the turbinates. As the turbinates heal, they will shrink. Nasal  splints may also be placed in the nose to help with healing.  AFTER THE PROCEDURE  You will be taken to a recovery area. A nurse will watch and check your progress.  When you are awake, feeling well, and taking fluids well, you may be allowed to go home.  For the first week after surgery, you may have a mild headache, stuffy nose, full sensation in the ears, or bloody fluid coming from the nose. Document Released: 09/26/2004 Document Revised: 03/25/2011 Document Reviewed: 02/11/2011 [facility] Patient Information 2015 [place], [place]. This information is not intended to replace advice given to you by your health care provider. Make sure you discuss any questions you have with your health care provider.  Tonsillectomy, Care After Refer to this sheet in the next few weeks. These instructions provide you with information on caring for yourself after your procedure. Your health care provider may also give you more specific instructions. Your treatment has been planned according to current medical practices, but problems sometimes occur. Call your health care provider if you have any problems or questions after your procedure. WHAT TO EXPECT AFTER THE PROCEDURE After your procedure, it is typical to have the following:  Your tongue will be numb, and your sense of taste will be reduced.  Your swallowing will be difficult and painful.  Your jaw may hurt or make a clicking noise when you yawn or chew.  Liquids that you drink may leak out of your nose.  Your voice may sound muffled.  The area at the middle of the roof of your mouth (uvula) may be very swollen.  You may have a constant cough and  need to clear mucus and phlegm from your throat. HOME CARE INSTRUCTIONS   Get proper rest, keeping your head elevated at all times.  Drink plenty of fluids. This reduces pain and hastens the healing process.  Only take over-the-counter or prescription medicines for pain, discomfort, or fever as  directed by your health care provider. Do not take aspirin or nonsteroidal anti-inflammatory drugs, such as ibuprofen, for 2 weeks after surgery. These medicines increase the possibility of bleeding.  Soft and cold foods, such as gelatin, sherbet, ice cream, frozen ice pops, and cold drinks, are usually the easiest to eat. Several days after surgery, you will be able to eat more solid food.  Avoid mouthwashes and gargles.  Avoid contact with people who have upper respiratory infections, such as colds and sore throats. SEEK MEDICAL CARE IF:   You have increasing pain that is not controlled with medicines.  You have a fever.  You have a rash.  You feel lightheaded or faint.  You are unable to swallow even small amounts of liquid or saliva.  Your urine is becoming very dark. SEEK IMMEDIATE MEDICAL CARE IF:   You have difficulty breathing.  You experience side effects or allergic reactions to medicines.  You bleed bright red blood from your throat, or you vomit bright red blood. MAKE SURE YOU:  Understand these instructions.  Will watch your condition.  Will get help right away if you are not doing well or get worse. Document Released: 11/01/2003 Document Revised: 01/05/2013 Document Reviewed: 07/28/2012 [facility] Patient Information 2015 [place], [place]. This information is not intended to replace advice given to you by your health care provider. Make sure you discuss any questions you have with your health care provider.   No strenuous activities for 2 weeks. May shower beginning 11 AUG Change drip pad as often as needed Rinse throat with cool dilute salt water to clear old blood and thick phlegm See our tonsillectomy and nasal hygiene instructions. Call for problems, question, [phone] Recheck for nasal splint removal on Friday, 19 AUG

## 2014-08-25 ENCOUNTER — Encounter (HOSPITAL_COMMUNITY): Payer: Self-pay | Admitting: Certified Registered Nurse Anesthetist

## 2014-08-25 DIAGNOSIS — J343 Hypertrophy of nasal turbinates: Secondary | ICD-10-CM | POA: Diagnosis not present

## 2014-08-25 DIAGNOSIS — G4733 Obstructive sleep apnea (adult) (pediatric): Secondary | ICD-10-CM | POA: Diagnosis present

## 2014-08-25 MED ORDER — CHLORHEXIDINE GLUCONATE 0.12 % MT SOLN
15.0000 mL | Freq: Two times a day (BID) | OROMUCOSAL | Status: DC
  Administered 2014-08-25: 15 mL via OROMUCOSAL
  Filled 2014-08-25 (×2): qty 15

## 2014-08-25 MED ORDER — CETYLPYRIDINIUM CHLORIDE 0.05 % MT LIQD: 7.0000 mL | Freq: Two times a day (BID) | OROMUCOSAL | Status: DC

## 2014-08-25 NOTE — Progress Notes (Signed)
Pt d/c home today, VSS, reviewed instructions w/ Pt & family, who indicated understanding.

## 2014-08-25 NOTE — Discharge Summary (Signed)
  08/25/2014 10:48 AM  Zenia Resides, Haelyn [phone]  Post-Op Day 1,m discharge summary    Temp:  [97.4 F (36.3 C)-98.6 F (37 C)] 98 F (36.7 C) (08/11 0700) Pulse Rate:  [71-114] 77 (08/11 0400) Resp:  [8-39] 16 (08/11 0600) BP: (139-191)/(66-104) 147/79 mmHg (08/11 0600) SpO2:  [87 %-100 %] 94 % (08/11 0500) Weight:  [111.3 kg (245 lb 6 oz)] 111.3 kg (245 lb 6 oz) (08/10 1543),     Intake/Output Summary (Last 24 hours) at 08/25/14 1048 Last data filed at 08/25/14 0400  Gross per 24 hour  Intake 2584.58 ml  Output    425 ml  Net 2159.58 ml    No results found for this or any previous visit (from the past 24 hour(s)).  SUBJECTIVE:  Min-mod pain, relieved with meds.  Taking decent po's.  Spont void.  OBJECTIVE:    Pharynx clear.  Breathing well.  Voice cl.  Nasal packs removed without difficulty.  IMPRESSION:  Satisfactory check  PLAN:  Elevation, ice, analgesia, antibiosis, hydration.  Discharge to home and care of family.  Admit:  10 AUG 16 Discharge: 11 AUG 16 Final Diagnosis:  Obstructive Sleep Apnea.  Deviated nasal septum.  Hypertrophic inferior nasal turbinates.  Tonsillar hypertrophy Proc:  Septoplasty, SMR inferior turbinates, Tonsillectomy, Uvulectomy.  10 AUG 16 Comp:  None Condition:  Ambulatory.  Pain controlled.  Taking po liquids.  Breathing well.  Packs removed Return visit:  8 days  Rx's:  Cephalexin, Oxycodone liquid Instructions written and given   Hosp Course:  Underwent surgery on day of admission.  Observed in step down unit overnight.  Occ low O2 sats, improved by AM POD 1.  Spont void.  Taking good po fluids.  Pain controlled.  Packs removed AM of POD 1.  Discharged to home and care of family.  Jodi Marble

## 2014-09-20 ENCOUNTER — Other Ambulatory Visit: Payer: Self-pay | Admitting: Family Medicine

## 2014-09-20 NOTE — Telephone Encounter (Signed)
Medication refilled per protocol. 

## 2014-10-28 ENCOUNTER — Ambulatory Visit (INDEPENDENT_AMBULATORY_CARE_PROVIDER_SITE_OTHER): Payer: PRIVATE HEALTH INSURANCE | Admitting: Family Medicine

## 2014-10-28 ENCOUNTER — Encounter: Payer: Self-pay | Admitting: Family Medicine

## 2014-10-28 VITALS — BP 132/90 | HR 76 | Temp 98.3°F | Resp 18 | Wt 222.0 lb

## 2014-10-28 DIAGNOSIS — E038 Other specified hypothyroidism: Secondary | ICD-10-CM

## 2014-10-28 DIAGNOSIS — E781 Pure hyperglyceridemia: Secondary | ICD-10-CM | POA: Diagnosis not present

## 2014-10-28 LAB — CBC WITH DIFFERENTIAL/PLATELET
BASOS ABS: 0 10*3/uL (ref 0.0–0.1)
Basophils Relative: 1 % (ref 0–1)
EOS ABS: 0.1 10*3/uL (ref 0.0–0.7)
Eosinophils Relative: 2 % (ref 0–5)
HEMATOCRIT: 41.4 % (ref 36.0–46.0)
Hemoglobin: 13.5 g/dL (ref 12.0–15.0)
LYMPHS PCT: 47 % — AB (ref 12–46)
Lymphs Abs: 2.3 10*3/uL (ref 0.7–4.0)
MCH: 28.8 pg (ref 26.0–34.0)
MCHC: 32.6 g/dL (ref 30.0–36.0)
MCV: 88.5 fL (ref 78.0–100.0)
MPV: 8.9 fL (ref 8.6–12.4)
Monocytes Absolute: 0.3 10*3/uL (ref 0.1–1.0)
Monocytes Relative: 6 % (ref 3–12)
Neutro Abs: 2.1 10*3/uL (ref 1.7–7.7)
Neutrophils Relative %: 44 % (ref 43–77)
Platelets: 234 10*3/uL (ref 150–400)
RBC: 4.68 MIL/uL (ref 3.87–5.11)
RDW: 16.5 % — ABNORMAL HIGH (ref 11.5–15.5)
WBC: 4.8 10*3/uL (ref 4.0–10.5)

## 2014-10-28 LAB — TSH: TSH: 2.057 u[IU]/mL (ref 0.350–4.500)

## 2014-10-28 LAB — COMPLETE METABOLIC PANEL WITH GFR
ALBUMIN: 4.1 g/dL (ref 3.6–5.1)
ALT: 17 U/L (ref 6–29)
AST: 16 U/L (ref 10–35)
Alkaline Phosphatase: 70 U/L (ref 33–115)
BUN: 14 mg/dL (ref 7–25)
CALCIUM: 9.7 mg/dL (ref 8.6–10.2)
CHLORIDE: 105 mmol/L (ref 98–110)
CO2: 26 mmol/L (ref 20–31)
CREATININE: 0.57 mg/dL (ref 0.50–1.10)
GFR, Est Non African American: >89 mL/min (ref >=60)
Glucose, Bld: 89 mg/dL (ref 70–99)
POTASSIUM: 4.2 mmol/L (ref 3.5–5.3)
Sodium: 141 mmol/L (ref 135–146)
Total Bilirubin: 0.4 mg/dL (ref 0.2–1.2)
Total Protein: 7.1 g/dL (ref 6.1–8.1)

## 2014-10-28 LAB — LIPID PANEL
CHOL/HDL RATIO: 6.1 ratio — AB (ref <=5.0)
Cholesterol: 176 mg/dL (ref 125–200)
HDL: 29 mg/dL — ABNORMAL LOW (ref >=46)
LDL Cholesterol: 114 mg/dL (ref <130)
Triglycerides: 164 mg/dL — ABNORMAL HIGH (ref <150)
VLDL: 33 mg/dL — ABNORMAL HIGH (ref <30)

## 2014-10-28 MED ORDER — ALPRAZOLAM 0.5 MG PO TABS: 0.5000 mg | ORAL_TABLET | Freq: Three times a day (TID) | ORAL | Status: DC | PRN

## 2014-10-28 NOTE — Progress Notes (Signed)
Subjective:    Patient ID: [name] [name], female    DOB: 02/09/1965, 49 y.o.   MRN: [id number]  HPI Patient is here today for a regular check up.  She continues to smoke.  She is overdue for a mammogram.  She's been taking levothyroxine 112 g by mouth daily. She missed approximately 1 week in August when she had her thyroid removed it is been taking it regularly ever since. She also had her gallbladder removed in September. She denies any myalgias or right upper quadrant pain. She's been taking fenofibrate 160 mg by mouth daily. She is due to recheck her cholesterol. Past Medical History  Diagnosis Date  . Thyroid disease   . Hypertension   . Pneumonia   . Ovarian cancer   . Anxiety   . Depression   . Kidney stone   . Sleep apnea   . Tonsillar and adenoid hypertrophy   . OSA on CPAP   . Nicotine dependence, cigarettes, with other nicotine-induced disorders   . Nocturia   . Hypothyroidism   . Cough    Past Surgical History  Procedure Laterality Date  . Abdominal hysterectomy  2003  . Tubal ligation    . Hemorrhoid surgery  2007  . Septoplasty N/A 08/24/2014    Procedure: SEPTOPLASTY;  Surgeon: Jodi Marble, MD;  Location: [place];  Service: ENT;  Laterality: N/A;  . Turbinate reduction N/A 08/24/2014    Procedure: TURBINATE REDUCTION AND UVULECTOMY;  Surgeon: Jodi Marble, MD;  Location: [place];  Service: ENT;  Laterality: N/A;  . Tonsillectomy N/A 08/24/2014    Procedure: TONSILLECTOMY;  Surgeon: Jodi Marble, MD;  Location: [place] [facility];  Service: ENT;  Laterality: N/A;   Current Outpatient Prescriptions on File Prior to Visit  Medication Sig Dispense Refill  . ALPRAZolam (XANAX) 0.5 MG tablet Take 1 tablet (0.5 mg total) by mouth 3 (three) times daily as needed for anxiety. Sleep and/or anxiety (Patient taking differently: Take 0.5 mg by mouth daily as needed for anxiety. ) 60 tablet 0  . DULoxetine (CYMBALTA) 60 MG capsule TAKE ONE CAPSULE BY MOUTH ONCE DAILY 90 capsule 0  .  fenofibrate 160 MG tablet Take 1 tablet (160 mg total) by mouth daily. 30 tablet 5  . levothyroxine (SYNTHROID, LEVOTHROID) 112 MCG tablet Take 1 tablet (112 mcg total) by mouth daily before breakfast. 30 tablet 5  . Multiple Vitamin (MULTIVITAMIN WITH MINERALS) TABS tablet Take 1 tablet by mouth daily.    . Omega-3 Fatty Acids (FISH OIL) 1000 MG CAPS Take 2,000 mg by mouth daily.      No current facility-administered medications on file prior to visit.   Allergies  Allergen Reactions  . Other Other (See Comments)    IV CONTRAST caused crazy thoughts and speech   Social History   Social History  . Marital Status: Married    Spouse Name: [name]  . Number of Children: 1  . Years of Education: 12   Occupational History  .  [place] History Main Topics  . Smoking status: Current Every Day Smoker -- 1.00 packs/day for 17 years    Types: Cigarettes  . Smokeless tobacco: Never Used  . Alcohol Use: No  . Drug Use: No  . Sexual Activity: Not on file   Other Topics Concern  . Not on file   Social History Narrative   Patient is married [name]) and lives at home with her husband.   Patient has one adult child.  Patient is working full-time.   Patient has a high school education.   Patient is left-handed.   Patient drinks a 2 liter per day.     Review of Systems  All other systems reviewed and are negative.      Objective:   Physical Exam  Constitutional: She appears well-developed and well-nourished.  Neck: Neck supple.  Cardiovascular: Normal rate, regular rhythm, normal heart sounds and intact distal pulses.   No murmur heard. Pulmonary/Chest: Effort normal and breath sounds normal. No respiratory distress. She has no wheezes. She has no rales. She exhibits no tenderness.  Abdominal: Soft. Bowel sounds are normal. She exhibits no distension. There is no tenderness. There is no rebound and no guarding.  Musculoskeletal: She exhibits no edema.    Lymphadenopathy:    She has no cervical adenopathy.  Vitals reviewed.         Assessment & Plan:  Other specified hypothyroidism - Plan: COMPLETE METABOLIC PANEL WITH GFR, CBC with Differential/Platelet, Lipid panel, TSH  Hypertriglyceridemia - Plan: COMPLETE METABOLIC PANEL WITH GFR, CBC with Differential/Platelet, Lipid panel  I encouraged the patient quit smoking. Her flu shot is up-to-date. I will schedule her for mammogram. I will check a fasting lipid panel. I will check a TSH. I'll also refill the patient's  Xanax.

## 2014-10-31 ENCOUNTER — Encounter: Payer: Self-pay | Admitting: Family Medicine

## 2014-11-30 ENCOUNTER — Other Ambulatory Visit: Payer: Self-pay | Admitting: Family Medicine

## 2014-11-30 NOTE — Telephone Encounter (Signed)
Pt is calling for a refill of Levothyroxine. She uses the [facility] in [place]. Pt ph: [phone]

## 2014-12-01 MED ORDER — LEVOTHYROXINE SODIUM 112 MCG PO TABS: 112.0000 ug | ORAL_TABLET | Freq: Every day | ORAL | Status: DC

## 2014-12-01 NOTE — Telephone Encounter (Signed)
Prescription sent to pharmacy.

## 2014-12-28 ENCOUNTER — Other Ambulatory Visit: Payer: Self-pay | Admitting: Family Medicine

## 2014-12-28 NOTE — Telephone Encounter (Signed)
Refill appropriate and filled per protocol. 

## 2015-01-30 ENCOUNTER — Telehealth: Payer: Self-pay | Admitting: Family Medicine

## 2015-01-30 NOTE — Telephone Encounter (Signed)
Patient calling to speak to you regarding her condition with arm pain  [phone]

## 2015-01-30 NOTE — Telephone Encounter (Signed)
Pt was wanting to come in with husband in the am instead of coming back at 3 - appt rescheduled.

## 2015-01-31 ENCOUNTER — Ambulatory Visit (INDEPENDENT_AMBULATORY_CARE_PROVIDER_SITE_OTHER): Payer: PRIVATE HEALTH INSURANCE | Admitting: Family Medicine

## 2015-01-31 ENCOUNTER — Ambulatory Visit: Payer: PRIVATE HEALTH INSURANCE | Admitting: Family Medicine

## 2015-01-31 ENCOUNTER — Encounter: Payer: Self-pay | Admitting: Family Medicine

## 2015-01-31 VITALS — BP 130/90 | HR 78 | Temp 98.4°F | Resp 16 | Ht 63.5 in | Wt 220.0 lb

## 2015-01-31 DIAGNOSIS — M25511 Pain in right shoulder: Secondary | ICD-10-CM

## 2015-01-31 MED ORDER — CYCLOBENZAPRINE HCL 10 MG PO TABS: 10.0000 mg | ORAL_TABLET | Freq: Three times a day (TID) | ORAL | Status: DC | PRN

## 2015-01-31 NOTE — Progress Notes (Signed)
Subjective:    Patient ID: [name] [name], female    DOB: 04-02-1965, 50 y.o.   MRN: [id number]  HPI Patient presents with several weeks of pain in her right shoulder. She is unable to abduct her arm greater than 90. She reports pain with internal and sternal rotation. She has a positive empty can sign and a positive Hawkins sign. However she also complains of pain radiating from her neck all the way down into her right hand. On examination today she also has pain with Spurling's maneuver. She has normal reflexes and normal grip strength. Muscle strength is 5 over 5 equal and symmetric with elbow flexion and extension. However she is unable to lift her arm over her shoulder. Past Medical History  Diagnosis Date  . Thyroid disease   . Hypertension   . Pneumonia   . Ovarian cancer ([place])   . Anxiety   . Depression   . Kidney stone   . Sleep apnea   . Tonsillar and adenoid hypertrophy   . OSA on CPAP   . Nicotine dependence, cigarettes, with other nicotine-induced disorders   . Nocturia   . Hypothyroidism   . Cough    Past Surgical History  Procedure Laterality Date  . Abdominal hysterectomy  2003  . Tubal ligation    . Hemorrhoid surgery  2007  . Septoplasty N/A 08/24/2014    Procedure: SEPTOPLASTY;  Surgeon: Jodi Marble, MD;  Location: [facility];  Service: ENT;  Laterality: N/A;  . Turbinate reduction N/A 08/24/2014    Procedure: TURBINATE REDUCTION AND UVULECTOMY;  Surgeon: Jodi Marble, MD;  Location: [place];  Service: ENT;  Laterality: N/A;  . Tonsillectomy N/A 08/24/2014    Procedure: TONSILLECTOMY;  Surgeon: Jodi Marble, MD;  Location: [place];  Service: ENT;  Laterality: N/A;  . Cholecystectomy     Current Outpatient Prescriptions on File Prior to Visit  Medication Sig Dispense Refill  . ALPRAZolam (XANAX) 0.5 MG tablet Take 1 tablet (0.5 mg total) by mouth 3 (three) times daily as needed for anxiety. Sleep and/or anxiety 60 tablet 0  . DULoxetine (CYMBALTA) 60 MG capsule TAKE  ONE CAPSULE BY MOUTH ONCE DAILY 90 capsule 0  . fenofibrate 160 MG tablet Take 1 tablet (160 mg total) by mouth daily. 30 tablet 5  . levothyroxine (SYNTHROID, LEVOTHROID) 112 MCG tablet Take 1 tablet (112 mcg total) by mouth daily before breakfast. 30 tablet 5  . Multiple Vitamin (MULTIVITAMIN WITH MINERALS) TABS tablet Take 1 tablet by mouth daily.    . Omega-3 Fatty Acids (FISH OIL) 1000 MG CAPS Take 2,000 mg by mouth daily.      No current facility-administered medications on file prior to visit.   Allergies  Allergen Reactions  . Other Other (See Comments)    IV CONTRAST caused crazy thoughts and speech   Social History   Social History  . Marital Status: Married    Spouse Name: [name]  . Number of Children: 1  . Years of Education: 12   Occupational History  .  [place] History Main Topics  . Smoking status: Current Every Day Smoker -- 1.00 packs/day for 17 years    Types: Cigarettes  . Smokeless tobacco: Never Used  . Alcohol Use: No  . Drug Use: No  . Sexual Activity: Not on file   Other Topics Concern  . Not on file   Social History Narrative   Patient is married [name]) and lives at home with  her husband.   Patient has one adult child.   Patient is working full-time.   Patient has a high school education.   Patient is left-handed.   Patient drinks a 2 liter per day.      Review of Systems  All other systems reviewed and are negative.      Objective:   Physical Exam  Cardiovascular: Normal rate, regular rhythm and normal heart sounds.   Pulmonary/Chest: Effort normal and breath sounds normal.  Musculoskeletal:       Right shoulder: She exhibits decreased range of motion, tenderness, pain and spasm.       Cervical back: She exhibits decreased range of motion and tenderness.  Vitals reviewed.         Assessment & Plan:  Right shoulder pain  I'm unsure based on the history and physical although I believe this is rotator cuff  tendinitis I cannot completely exclude cervical radiculopathy. I will treat it is rotator cuff tendinitis. After obtaining informed consent, I injected the right shoulder using sterile technique with a mixture of 2 mL of lidocaine, 2 mL of Marcaine, and 2 mL of 40 mg per mL Kenalog. Recheck on Friday if no better or sooner if worse. Consider an MRI of the neck if pain worsens

## 2015-03-26 ENCOUNTER — Other Ambulatory Visit: Payer: Self-pay | Admitting: Family Medicine

## 2015-03-27 NOTE — Telephone Encounter (Signed)
Medication refilled per protocol. 

## 2015-05-18 ENCOUNTER — Ambulatory Visit (INDEPENDENT_AMBULATORY_CARE_PROVIDER_SITE_OTHER): Payer: PRIVATE HEALTH INSURANCE | Admitting: Family Medicine

## 2015-05-18 ENCOUNTER — Encounter: Payer: Self-pay | Admitting: Family Medicine

## 2015-05-18 VITALS — BP 136/100 | HR 82 | Temp 98.4°F | Resp 18 | Wt 217.0 lb

## 2015-05-18 DIAGNOSIS — F411 Generalized anxiety disorder: Secondary | ICD-10-CM | POA: Diagnosis not present

## 2015-05-18 DIAGNOSIS — M25511 Pain in right shoulder: Secondary | ICD-10-CM

## 2015-05-18 DIAGNOSIS — I10 Essential (primary) hypertension: Secondary | ICD-10-CM | POA: Diagnosis not present

## 2015-05-18 MED ORDER — LEVOTHYROXINE SODIUM 112 MCG PO TABS: 112.0000 ug | ORAL_TABLET | Freq: Every day | ORAL | Status: DC

## 2015-05-18 MED ORDER — LOSARTAN POTASSIUM 50 MG PO TABS
50.0000 mg | ORAL_TABLET | Freq: Every day | ORAL | Status: DC
Start: 2015-05-18 — End: 2015-10-26

## 2015-05-18 MED ORDER — ALPRAZOLAM 0.5 MG PO TABS: 0.5000 mg | ORAL_TABLET | Freq: Three times a day (TID) | ORAL | Status: DC | PRN

## 2015-05-18 NOTE — Progress Notes (Signed)
Subjective:    Patient ID: [name] [name], female    DOB: 10/28/1965, 50 y.o.   MRN: [id number]  HPI  The patient received an injection in her right shoulder. The injection helped substantially however over the last week the pain has returned. She has pain with abduction greater than 90, pain with internal and Rotation. It aches and throbs at night. She is requesting a repeat cortisone injection. She is also asking for refill on her Xanax which she uses sparingly for anxiety. She is also due for refill on her levothyroxine. Unfortunately she continues to smoke. She also reports daily headaches. She is taking 4 and 5 BC powders a day. Past Medical History  Diagnosis Date  . Thyroid disease   . Hypertension   . Pneumonia   . Ovarian cancer ([place])   . Anxiety   . Depression   . Kidney stone   . Sleep apnea   . Tonsillar and adenoid hypertrophy   . OSA on CPAP   . Nicotine dependence, cigarettes, with other nicotine-induced disorders   . Nocturia   . Hypothyroidism   . Cough    Past Surgical History  Procedure Laterality Date  . Abdominal hysterectomy  2003  . Tubal ligation    . Hemorrhoid surgery  2007  . Septoplasty N/A 08/24/2014    Procedure: SEPTOPLASTY;  Surgeon: Jodi Marble, MD;  Location: [place] [place];  Service: ENT;  Laterality: N/A;  . Turbinate reduction N/A 08/24/2014    Procedure: TURBINATE REDUCTION AND UVULECTOMY;  Surgeon: Jodi Marble, MD;  Location: [facility];  Service: ENT;  Laterality: N/A;  . Tonsillectomy N/A 08/24/2014    Procedure: TONSILLECTOMY;  Surgeon: Jodi Marble, MD;  Location: [facility];  Service: ENT;  Laterality: N/A;  . Cholecystectomy     Current Outpatient Prescriptions on File Prior to Visit  Medication Sig Dispense Refill  . cyclobenzaprine (FLEXERIL) 10 MG tablet Take 1 tablet (10 mg total) by mouth 3 (three) times daily as needed for muscle spasms. 30 tablet 0  . DULoxetine (CYMBALTA) 60 MG capsule TAKE ONE CAPSULE BY MOUTH ONCE DAILY 90 capsule 0  .  fenofibrate 160 MG tablet Take 1 tablet (160 mg total) by mouth daily. 30 tablet 5  . Multiple Vitamin (MULTIVITAMIN WITH MINERALS) TABS tablet Take 1 tablet by mouth daily.    . Omega-3 Fatty Acids (FISH OIL) 1000 MG CAPS Take 2,000 mg by mouth daily.      No current facility-administered medications on file prior to visit.   Allergies  Allergen Reactions  . Other Other (See Comments)    IV CONTRAST caused crazy thoughts and speech   Social History   Social History  . Marital Status: Married    Spouse Name: [name]  . Number of Children: 1  . Years of Education: 12   Occupational History  .  [place] History Main Topics  . Smoking status: Current Every Day Smoker -- 1.00 packs/day for 17 years    Types: Cigarettes  . Smokeless tobacco: Never Used  . Alcohol Use: No  . Drug Use: No  . Sexual Activity: Not on file   Other Topics Concern  . Not on file   Social History Narrative   Patient is married [name]) and lives at home with her husband.   Patient has one adult child.   Patient is working full-time.   Patient has a high school education.   Patient is left-handed.   Patient drinks a  2 liter per day.     Review of Systems  All other systems reviewed and are negative.      Objective:   Physical Exam  Constitutional: She is oriented to person, place, and time.  Cardiovascular: Normal rate and normal heart sounds.   Pulmonary/Chest: Effort normal and breath sounds normal. No respiratory distress. She has no wheezes. She has no rales.  Musculoskeletal:       Right shoulder: She exhibits decreased range of motion, tenderness and pain.  Neurological: She is alert and oriented to person, place, and time. She has normal reflexes. She displays normal reflexes. No cranial nerve deficit. She exhibits normal muscle tone. Coordination normal.  Vitals reviewed.         Assessment & Plan:  Benign essential HTN - Plan: losartan (COZAAR) 50 MG  tablet  Right shoulder pain  GAD (generalized anxiety disorder)  Her blood pressure is too high today, I'll start her on losartan 50 mg by mouth daily and recheck blood pressure in 2 weeks. I would like her to return fasting for a CBC, CMP, fasting lipid panel, and a TSH. Meanwhile I'll refill her levothyroxine along with her Xanax. Using sterile technique, I injected her right shoulder with 2 mL of lidocaine, 2 mL of Marcaine, and 2 mL of 40 mg per mL Kenalog. She tolerated the procedure well without complication. If the pain returns again, I would recommend an MRI of the shoulder and an orthopedics consultation versus physical therapy depending upon the results. I strongly recommended smoking cessation. Also recommended that the patient gradually wean off BC powders as I believe she is likely getting rebound headaches. If the headaches persist off the [facility] powders, I would recommend starting Topamax.

## 2015-06-07 ENCOUNTER — Other Ambulatory Visit: Payer: PRIVATE HEALTH INSURANCE

## 2015-06-07 DIAGNOSIS — Z79899 Other long term (current) drug therapy: Secondary | ICD-10-CM

## 2015-06-07 DIAGNOSIS — E782 Mixed hyperlipidemia: Secondary | ICD-10-CM

## 2015-06-07 LAB — CBC WITH DIFFERENTIAL/PLATELET
Basophils Absolute: 0 cells/uL (ref 0–200)
Basophils Relative: 0 %
EOS PCT: 2 %
Eosinophils Absolute: 152 cells/uL (ref 15–500)
HCT: 42.2 % (ref 35.0–45.0)
Hemoglobin: 14.1 g/dL (ref 12.0–15.0)
LYMPHS ABS: 3876 {cells}/uL (ref 850–3900)
Lymphocytes Relative: 51 %
MCH: 30.3 pg (ref 27.0–33.0)
MCHC: 33.4 g/dL (ref 32.0–36.0)
MCV: 90.6 fL (ref 80.0–100.0)
MPV: 8.7 fL (ref 7.5–12.5)
Monocytes Absolute: 456 cells/uL (ref 200–950)
Monocytes Relative: 6 %
NEUTROS PCT: 41 %
Neutro Abs: 3116 cells/uL (ref 1500–7800)
Platelets: 241 10*3/uL (ref 140–400)
RBC: 4.66 MIL/uL (ref 3.80–5.10)
RDW: 15 % (ref 11.0–15.0)
WBC: 7.6 10*3/uL (ref 3.8–10.8)

## 2015-06-07 LAB — LIPID PANEL
Cholesterol: 209 mg/dL — ABNORMAL HIGH (ref 125–200)
HDL: 42 mg/dL — ABNORMAL LOW (ref >=46)
LDL Cholesterol: 134 mg/dL — ABNORMAL HIGH (ref <130)
Total CHOL/HDL Ratio: 5 Ratio (ref <=5.0)
Triglycerides: 165 mg/dL — ABNORMAL HIGH (ref <150)
VLDL: 33 mg/dL — ABNORMAL HIGH (ref <30)

## 2015-06-07 LAB — COMPREHENSIVE METABOLIC PANEL
ALT: 18 U/L (ref 6–29)
AST: 12 U/L (ref 10–35)
Albumin: 3.9 g/dL (ref 3.6–5.1)
Alkaline Phosphatase: 58 U/L (ref 33–115)
BILIRUBIN TOTAL: 0.3 mg/dL (ref 0.2–1.2)
BUN: 16 mg/dL (ref 7–25)
CO2: 25 mmol/L (ref 20–31)
Calcium: 9.3 mg/dL (ref 8.6–10.2)
Chloride: 105 mmol/L (ref 98–110)
Creat: 0.56 mg/dL (ref 0.50–1.10)
Glucose, Bld: 79 mg/dL (ref 70–99)
Potassium: 5 mmol/L (ref 3.5–5.3)
Sodium: 138 mmol/L (ref 135–146)
Total Protein: 6.6 g/dL (ref 6.1–8.1)

## 2015-06-25 ENCOUNTER — Other Ambulatory Visit: Payer: Self-pay | Admitting: Family Medicine

## 2015-06-26 NOTE — Telephone Encounter (Signed)
Refill appropriate and filled per protocol. 

## 2015-09-21 IMAGING — CR DG CHEST 2V
2 series · 2 of 2 positions shown · non-contrast
Comparison: none

[view not recorded (1 of 2)]
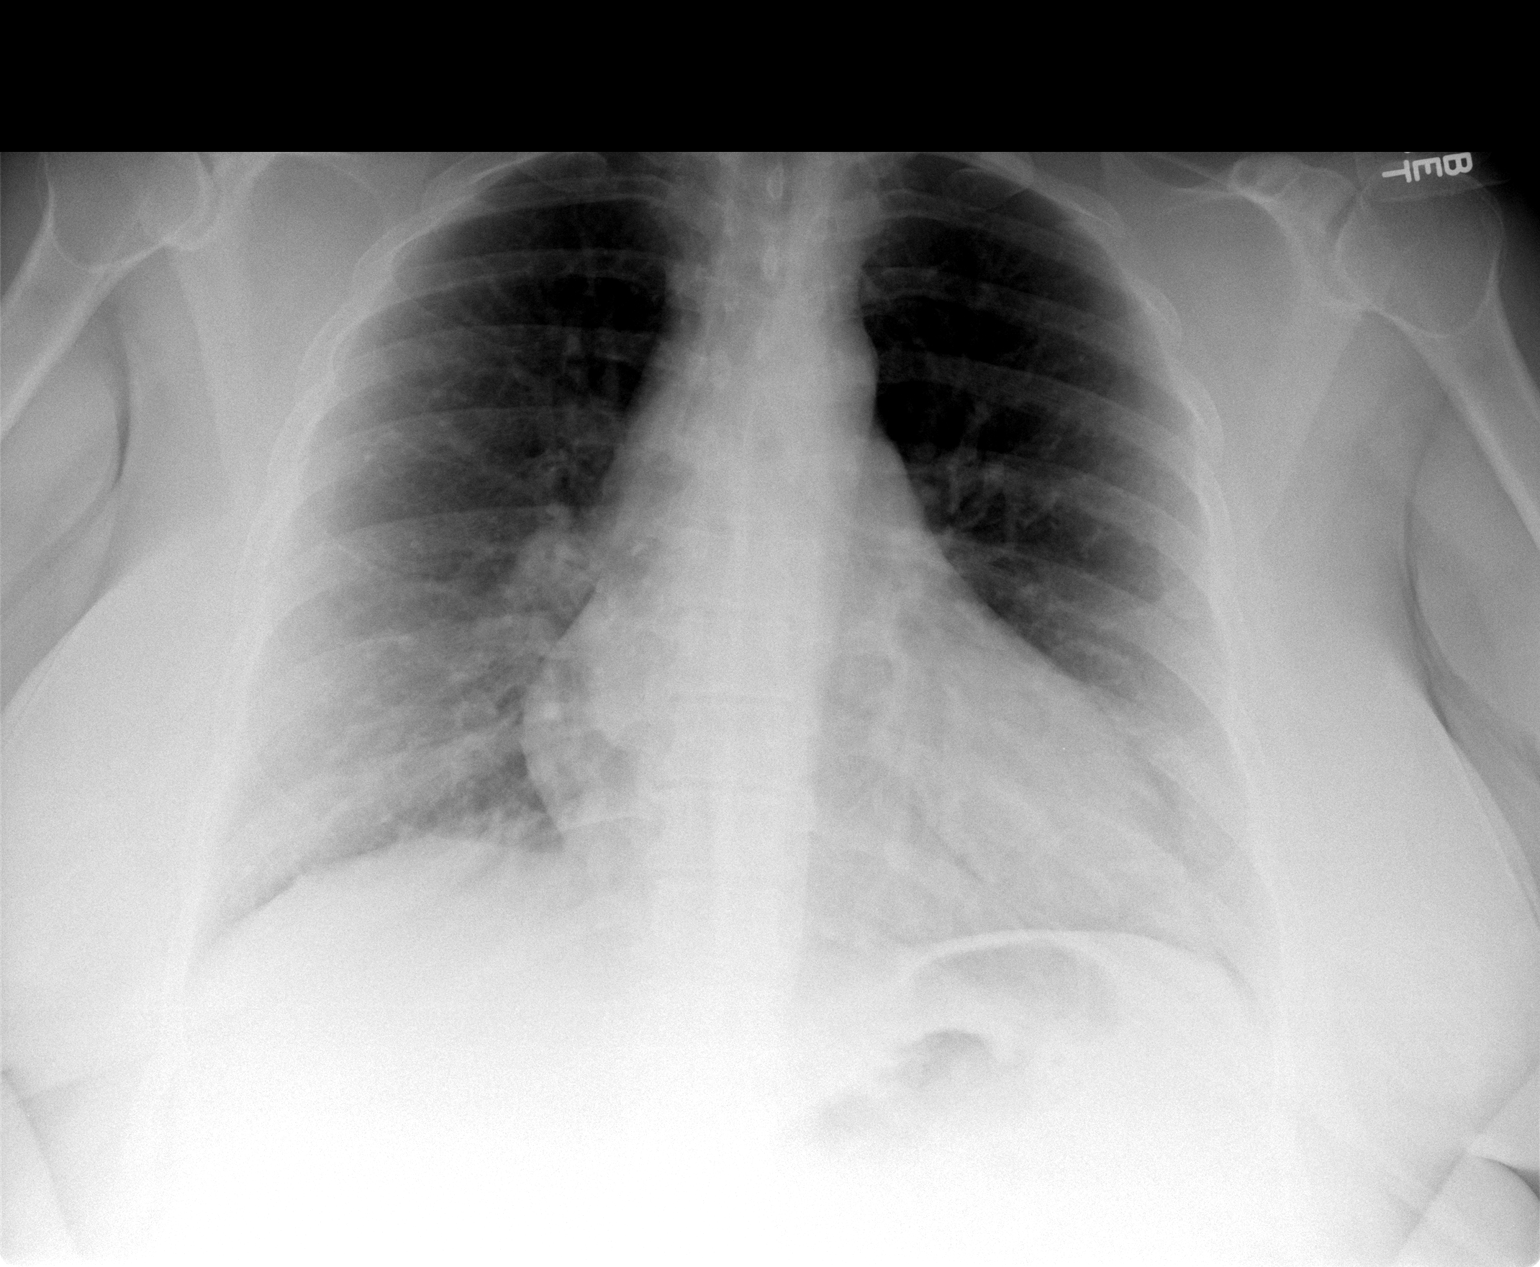

[view not recorded (2 of 2)]
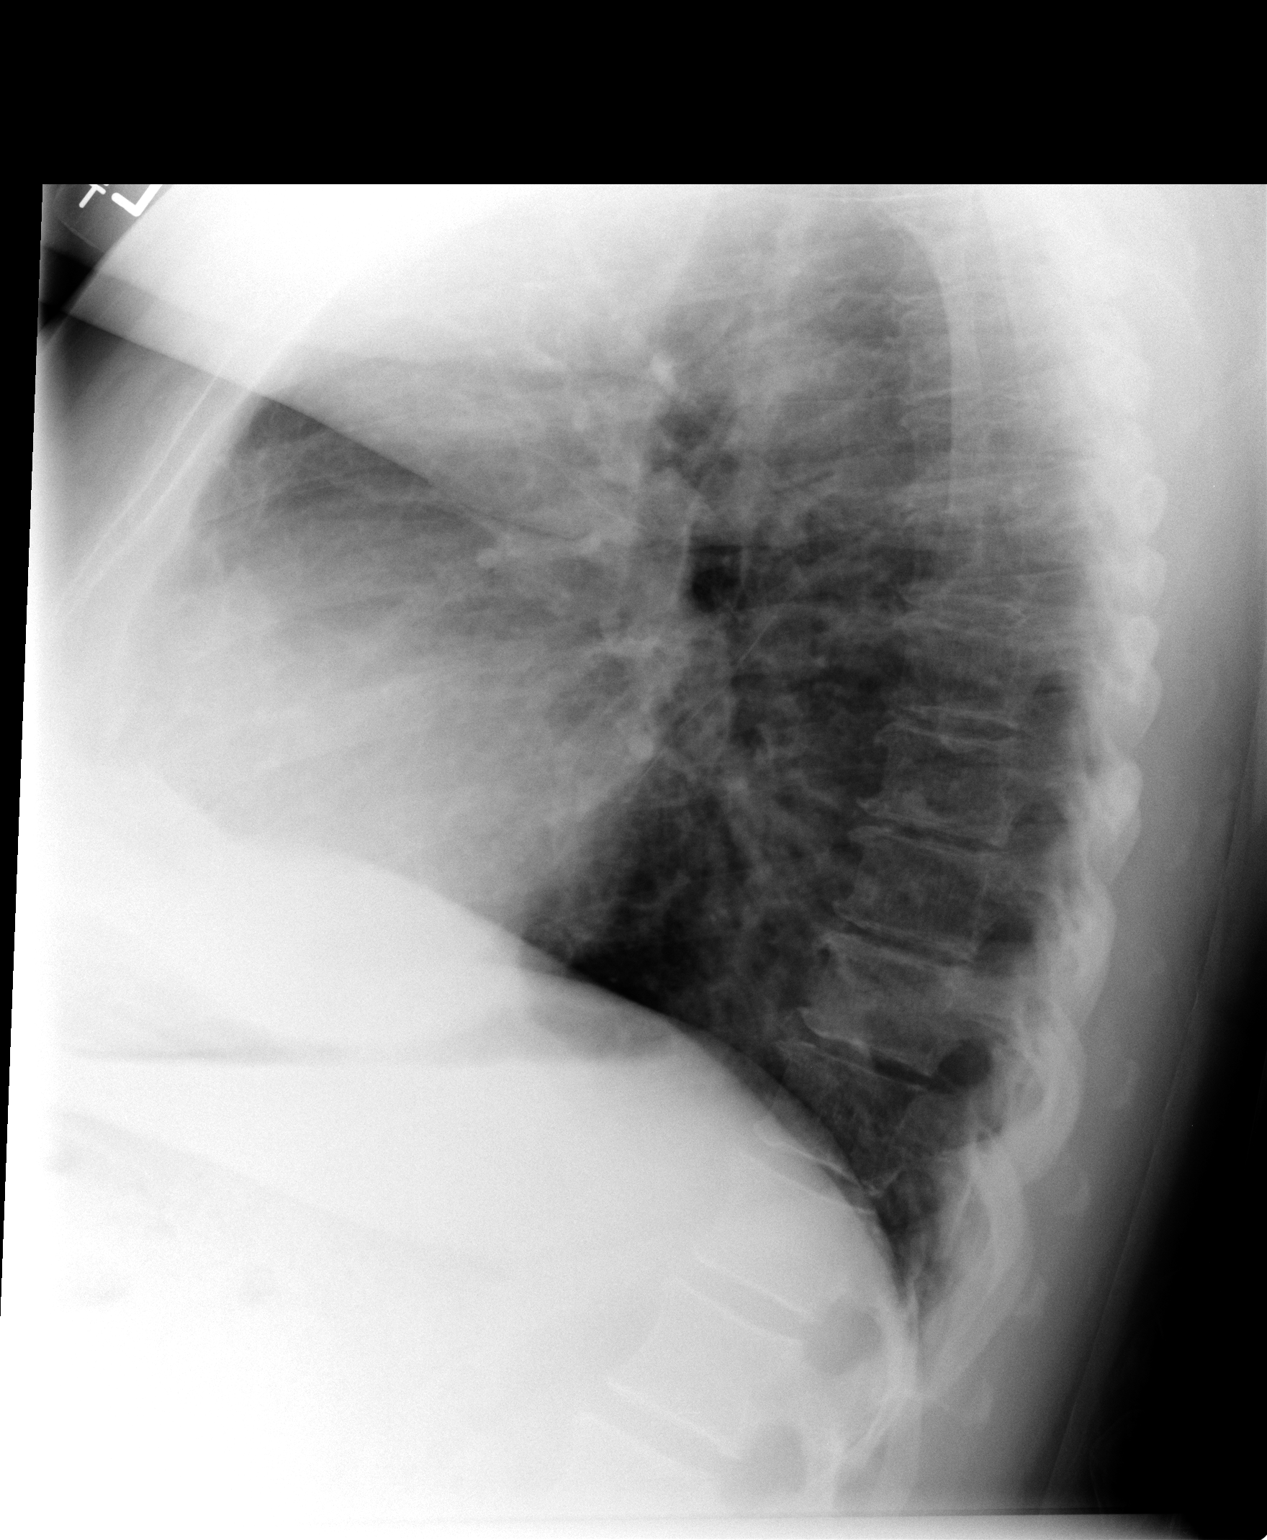

[2 of 2 positions shown; findings below may reference images not displayed]

CLINICAL DATA
Nausea, weakness, hypertension, ovarian cancer, smoker.

EXAM
CHEST  2 VIEW

COMPARISON
11/24/2012

FINDINGS
Heart size upper normal to mildly enlarged, similar prior. Mild
aortic tortuosity is similar to prior. Mild interstitial prominence
is similar to prior. No confluent airspace opacity, pleural
effusion, or pneumothorax. Mild multilevel degenerative changes. No
acute osseous finding.

IMPRESSION
Heart size upper normal to mildly enlarged. Mild chronic increased
interstitial markings. No acute process identified.

SIGNATURE

## 2015-10-03 ENCOUNTER — Other Ambulatory Visit: Payer: Self-pay | Admitting: Family Medicine

## 2015-10-12 ENCOUNTER — Ambulatory Visit (INDEPENDENT_AMBULATORY_CARE_PROVIDER_SITE_OTHER): Payer: PRIVATE HEALTH INSURANCE | Admitting: Family Medicine

## 2015-10-12 ENCOUNTER — Encounter: Payer: Self-pay | Admitting: Family Medicine

## 2015-10-12 VITALS — BP 160/110 | HR 88 | Temp 98.3°F | Resp 18 | Ht 63.5 in | Wt 220.0 lb

## 2015-10-12 DIAGNOSIS — I10 Essential (primary) hypertension: Secondary | ICD-10-CM | POA: Diagnosis not present

## 2015-10-12 DIAGNOSIS — R55 Syncope and collapse: Secondary | ICD-10-CM

## 2015-10-12 DIAGNOSIS — R51 Headache: Secondary | ICD-10-CM | POA: Diagnosis not present

## 2015-10-12 DIAGNOSIS — R519 Headache, unspecified: Secondary | ICD-10-CM

## 2015-10-12 MED ORDER — LOSARTAN POTASSIUM-HCTZ 100-25 MG PO TABS: 1.0000 | ORAL_TABLET | Freq: Every day | ORAL | 3 refills | Status: DC

## 2015-10-12 MED ORDER — AMLODIPINE BESYLATE 5 MG PO TABS: 5.0000 mg | ORAL_TABLET | Freq: Every day | ORAL | 3 refills | Status: DC

## 2015-10-12 NOTE — Progress Notes (Signed)
Subjective:    Patient ID: [name] [name], female    DOB: [birth year], 50 y.o.   MRN: [name]  HPI 5/17 The patient received an injection in her right shoulder. The injection helped substantially however over the last week the pain has returned. She has pain with abduction greater than 90, pain with internal and Rotation. It aches and throbs at night. She is requesting a repeat cortisone injection. She is also asking for refill on her Xanax which she uses sparingly for anxiety. She is also due for refill on her levothyroxine. Unfortunately she continues to smoke. She also reports daily headaches. She is taking 4 and 5 BC powders a day.  At that time, my plan was: Her blood pressure is too high today, I'll start her on losartan 50 mg by mouth daily and recheck blood pressure in 2 weeks. I would like her to return fasting for a CBC, CMP, fasting lipid panel, and a TSH. Meanwhile I'll refill her levothyroxine along with her Xanax. Using sterile technique, I injected her right shoulder with 2 mL of lidocaine, 2 mL of Marcaine, and 2 mL of 40 mg per mL Kenalog. She tolerated the procedure well without complication. If the pain returns again, I would recommend an MRI of the shoulder and an orthopedics consultation versus physical therapy depending upon the results. I strongly recommended smoking cessation. Also recommended that the patient gradually wean off BC powders as I believe she is likely getting rebound headaches. If the headaches persist off the [name] powders, I would recommend starting Topamax.  10/12/15 1 week ago, at work, the patient's blood pressure became significantly elevated.  Systolic blood pressure ranged between 160-190/100-120. At that time, the patient developed a severe headache and felt like she was going to pass out. She was seen in the emergency room and was given Valium and her blood pressure improved. However it still has remained elevated and is elevated here today. She reports a  dull headache every day. She denies any chest pain shortness of breath or dyspnea on exertion. She denies any neurologic deficits. Unfortunately she is drinking and taking excessive amounts of caffeine every day still and she is experiencing what I believe her withdrawal headaches every day.  She continues to use [name] powders Past Medical History:  Diagnosis Date  . Anxiety   . Cough   . Depression   . Hypertension   . Hypothyroidism   . Kidney stone   . Nicotine dependence, cigarettes, with other nicotine-induced disorders   . Nocturia   . OSA on CPAP   . Ovarian cancer ([place])   . Pneumonia   . Sleep apnea   . Thyroid disease   . Tonsillar and adenoid hypertrophy    Past Surgical History:  Procedure Laterality Date  . ABDOMINAL HYSTERECTOMY  2003  . CHOLECYSTECTOMY    . HEMORRHOID SURGERY  2007  . SEPTOPLASTY N/A 08/24/2014   Procedure: SEPTOPLASTY;  Surgeon: Jodi Marble, MD;  Location: [place];  Service: ENT;  Laterality: N/A;  . TONSILLECTOMY N/A 08/24/2014   Procedure: TONSILLECTOMY;  Surgeon: Jodi Marble, MD;  Location: [place];  Service: ENT;  Laterality: N/A;  . TUBAL LIGATION    . TURBINATE REDUCTION N/A 08/24/2014   Procedure: TURBINATE REDUCTION AND UVULECTOMY;  Surgeon: Jodi Marble, MD;  Location: [place];  Service: ENT;  Laterality: N/A;   Current Outpatient Prescriptions on File Prior to Visit  Medication Sig Dispense Refill  . ALPRAZolam (XANAX) 0.5 MG tablet Take  1 tablet (0.5 mg total) by mouth 3 (three) times daily as needed for anxiety. Sleep and/or anxiety 60 tablet 0  . cyclobenzaprine (FLEXERIL) 10 MG tablet Take 1 tablet (10 mg total) by mouth 3 (three) times daily as needed for muscle spasms. 30 tablet 0  . DULoxetine (CYMBALTA) 60 MG capsule TAKE ONE CAPSULE BY MOUTH ONCE DAILY 90 capsule 1  . fenofibrate 160 MG tablet Take 1 tablet (160 mg total) by mouth daily. 30 tablet 5  . levothyroxine (SYNTHROID, LEVOTHROID) 112 MCG tablet Take 1 tablet (112 mcg total) by  mouth daily before breakfast. 30 tablet 5  . losartan (COZAAR) 50 MG tablet Take 1 tablet (50 mg total) by mouth daily. 30 tablet 5  . Multiple Vitamin (MULTIVITAMIN WITH MINERALS) TABS tablet Take 1 tablet by mouth daily.    . Omega-3 Fatty Acids (FISH OIL) 1000 MG CAPS Take 2,000 mg by mouth daily.      No current facility-administered medications on file prior to visit.    Allergies  Allergen Reactions  . Other Other (See Comments)    IV CONTRAST caused crazy thoughts and speech   Social History   Social History  . Marital status: Married    Spouse name: [name]  . Number of children: 1  . Years of education: 30   Occupational History  .  [place] History Main Topics  . Smoking status: Current Every Day Smoker    Packs/day: 1.00    Years: 17.00    Types: Cigarettes  . Smokeless tobacco: Never Used  . Alcohol use No  . Drug use: No  . Sexual activity: Not on file   Other Topics Concern  . Not on file   Social History Narrative   Patient is married [name]) and lives at home with her husband.   Patient has one adult child.   Patient is working full-time.   Patient has a high school education.   Patient is left-handed.   Patient drinks a 2 liter per day.     Review of Systems  All other systems reviewed and are negative.      Objective:   Physical Exam  Constitutional: She is oriented to person, place, and time.  Cardiovascular: Normal rate and normal heart sounds.   Pulmonary/Chest: Effort normal and breath sounds normal. No respiratory distress. She has no wheezes. She has no rales.  Neurological: She is alert and oriented to person, place, and time. She has normal reflexes. No cranial nerve deficit. She exhibits normal muscle tone. Coordination normal.  Vitals reviewed.         Assessment & Plan:  Benign essential HTN - Plan: CBC with Differential/Platelet, COMPLETE METABOLIC PANEL WITH GFR, losartan-hydrochlorothiazide (HYZAAR)  100-25 MG tablet, amLODipine (NORVASC) 5 MG tablet  Chronic daily headache  Syncope, unspecified syncope type - Plan: CBC with Differential/Platelet, COMPLETE METABOLIC PANEL WITH GFR, TSH  Patient did not experience true syncope. She had a near syncopal episode that prompted her to go the emergency room last week at work. I believe some of that could've been vasovagal secondary to panic. However her blood pressures always slightly too high. Discontinue losartan and replaced with Hyzaar 100/25 one by mouth daily. In one week at amlodipine 5 mg by mouth daily if blood pressure still greater than 140/90. Discontinue all caffeine

## 2015-10-26 ENCOUNTER — Ambulatory Visit (INDEPENDENT_AMBULATORY_CARE_PROVIDER_SITE_OTHER): Payer: PRIVATE HEALTH INSURANCE | Admitting: Family Medicine

## 2015-10-26 VITALS — BP 120/80 | HR 80 | Temp 99.2°F | Resp 18 | Ht 63.0 in | Wt 220.0 lb

## 2015-10-26 DIAGNOSIS — R51 Headache: Secondary | ICD-10-CM

## 2015-10-26 DIAGNOSIS — I10 Essential (primary) hypertension: Secondary | ICD-10-CM | POA: Diagnosis not present

## 2015-10-26 DIAGNOSIS — E039 Hypothyroidism, unspecified: Secondary | ICD-10-CM | POA: Diagnosis not present

## 2015-10-26 DIAGNOSIS — R519 Headache, unspecified: Secondary | ICD-10-CM

## 2015-10-26 LAB — COMPLETE METABOLIC PANEL WITH GFR
ALT: 16 U/L (ref 6–29)
AST: 16 U/L (ref 10–35)
Albumin: 3.8 g/dL (ref 3.6–5.1)
Alkaline Phosphatase: 62 U/L (ref 33–115)
BUN: 17 mg/dL (ref 7–25)
CHLORIDE: 101 mmol/L (ref 98–110)
CO2: 25 mmol/L (ref 20–31)
CREATININE: 0.51 mg/dL (ref 0.50–1.10)
Calcium: 9.3 mg/dL (ref 8.6–10.2)
GFR, Est African American: >89 mL/min (ref >=60)
GFR, Est Non African American: >89 mL/min (ref >=60)
GLUCOSE: 90 mg/dL (ref 70–99)
Potassium: 4.2 mmol/L (ref 3.5–5.3)
SODIUM: 138 mmol/L (ref 135–146)
TOTAL PROTEIN: 6.5 g/dL (ref 6.1–8.1)
Total Bilirubin: 0.4 mg/dL (ref 0.2–1.2)

## 2015-10-26 LAB — TSH: TSH: 2.28 mIU/L

## 2015-10-26 MED ORDER — ALPRAZOLAM 0.5 MG PO TABS: 0.5000 mg | ORAL_TABLET | Freq: Three times a day (TID) | ORAL | 0 refills | Status: DC | PRN

## 2015-10-26 NOTE — Progress Notes (Signed)
Subjective:    Patient ID: [name] [name], female    DOB: 1965/09/08, 50 y.o.   MRN: [name]  HPI 5/17 The patient received an injection in her right shoulder. The injection helped substantially however over the last week the pain has returned. She has pain with abduction greater than 90, pain with internal and Rotation. It aches and throbs at night. She is requesting a repeat cortisone injection. She is also asking for refill on her Xanax which she uses sparingly for anxiety. She is also due for refill on her levothyroxine. Unfortunately she continues to smoke. She also reports daily headaches. She is taking 4 and 5 BC powders a day.  At that time, my plan was: Her blood pressure is too high today, I'll start her on losartan 50 mg by mouth daily and recheck blood pressure in 2 weeks. I would like her to return fasting for a CBC, CMP, fasting lipid panel, and a TSH. Meanwhile I'll refill her levothyroxine along with her Xanax. Using sterile technique, I injected her right shoulder with 2 mL of lidocaine, 2 mL of Marcaine, and 2 mL of 40 mg per mL Kenalog. She tolerated the procedure well without complication. If the pain returns again, I would recommend an MRI of the shoulder and an orthopedics consultation versus physical therapy depending upon the results. I strongly recommended smoking cessation. Also recommended that the patient gradually wean off BC powders as I believe she is likely getting rebound headaches. If the headaches persist off the [facility] powders, I would recommend starting Topamax.  10/12/15 1 week ago, at work, the patient's blood pressure became significantly elevated.  Systolic blood pressure ranged between 160-190/100-120. At that time, the patient developed a severe headache and felt like she was going to pass out. She was seen in the emergency room and was given Valium and her blood pressure improved. However it still has remained elevated and is elevated here today. She reports a  dull headache every day. She denies any chest pain shortness of breath or dyspnea on exertion. She denies any neurologic deficits. Unfortunately she is drinking and taking excessive amounts of caffeine every day still and she is experiencing what I believe her withdrawal headaches every day.  She continues to use BC powders.  At that time, my plan was: Patient did not experience true syncope. She had a near syncopal episode that prompted her to go the emergency room last week at work. I believe some of that could've been vasovagal secondary to panic. However her blood pressures always slightly too high. Discontinue losartan and replaced with Hyzaar 100/25 one by mouth daily. In one week add amlodipine 5 mg by mouth daily if blood pressure still greater than 140/90. Discontinue all caffeine  10/26/15 The patient's blood pressures much better. Today her blood pressure here is 120/80. She's been getting similar values at home. She never started the amlodipine. She did not have to. The higher dose of Hyzaar by itself was able to control her blood pressure. She is also stopped all caffeine. Her headaches are improving. Unfortunate she continues to smoke. Past Medical History:  Diagnosis Date  . Anxiety   . Cough   . Depression   . Hypertension   . Hypothyroidism   . Kidney stone   . Nicotine dependence, cigarettes, with other nicotine-induced disorders   . Nocturia   . OSA on CPAP   . Ovarian cancer ([place])   . Pneumonia   . Sleep apnea   .  Thyroid disease   . Tonsillar and adenoid hypertrophy    Past Surgical History:  Procedure Laterality Date  . ABDOMINAL HYSTERECTOMY  2003  . CHOLECYSTECTOMY    . HEMORRHOID SURGERY  2007  . SEPTOPLASTY N/A 08/24/2014   Procedure: SEPTOPLASTY;  Surgeon: Jodi Marble, MD;  Location: [place];  Service: ENT;  Laterality: N/A;  . TONSILLECTOMY N/A 08/24/2014   Procedure: TONSILLECTOMY;  Surgeon: Jodi Marble, MD;  Location: [place];  Service: ENT;  Laterality: N/A;   . TUBAL LIGATION    . TURBINATE REDUCTION N/A 08/24/2014   Procedure: TURBINATE REDUCTION AND UVULECTOMY;  Surgeon: Jodi Marble, MD;  Location: [place];  Service: ENT;  Laterality: N/A;   Current Outpatient Prescriptions on File Prior to Visit  Medication Sig Dispense Refill  . ALPRAZolam (XANAX) 0.5 MG tablet Take 1 tablet (0.5 mg total) by mouth 3 (three) times daily as needed for anxiety. Sleep and/or anxiety 60 tablet 0  . cyclobenzaprine (FLEXERIL) 10 MG tablet Take 1 tablet (10 mg total) by mouth 3 (three) times daily as needed for muscle spasms. 30 tablet 0  . DULoxetine (CYMBALTA) 60 MG capsule TAKE ONE CAPSULE BY MOUTH ONCE DAILY 90 capsule 1  . fenofibrate 160 MG tablet Take 1 tablet (160 mg total) by mouth daily. 30 tablet 5  . levothyroxine (SYNTHROID, LEVOTHROID) 112 MCG tablet Take 1 tablet (112 mcg total) by mouth daily before breakfast. 30 tablet 5  . losartan-hydrochlorothiazide (HYZAAR) 100-25 MG tablet Take 1 tablet by mouth daily. 90 tablet 3  . Multiple Vitamin (MULTIVITAMIN WITH MINERALS) TABS tablet Take 1 tablet by mouth daily.    . Omega-3 Fatty Acids (FISH OIL) 1000 MG CAPS Take 2,000 mg by mouth daily.     Marland Kitchen amLODipine (NORVASC) 5 MG tablet Take 1 tablet (5 mg total) by mouth daily. (Patient not taking: Reported on 10/26/2015) 90 tablet 3   No current facility-administered medications on file prior to visit.    Allergies  Allergen Reactions  . Other Other (See Comments)    IV CONTRAST caused crazy thoughts and speech   Social History   Social History  . Marital status: Married    Spouse name: [name]  . Number of children: 1  . Years of education: 39   Occupational History  .  [place] History Main Topics  . Smoking status: Current Every Day Smoker    Packs/day: 1.00    Years: 17.00    Types: Cigarettes  . Smokeless tobacco: Never Used  . Alcohol use No  . Drug use: No  . Sexual activity: Not on file   Other Topics Concern  .  Not on file   Social History Narrative   Patient is married [name]) and lives at home with her husband.   Patient has one adult child.   Patient is working full-time.   Patient has a high school education.   Patient is left-handed.   Patient drinks a 2 liter per day.     Review of Systems  All other systems reviewed and are negative.      Objective:   Physical Exam  Constitutional: She is oriented to person, place, and time.  Cardiovascular: Normal rate and normal heart sounds.   Pulmonary/Chest: Effort normal and breath sounds normal. No respiratory distress. She has no wheezes. She has no rales.  Neurological: She is alert and oriented to person, place, and time. She has normal reflexes. No cranial nerve deficit. She exhibits normal  muscle tone. Coordination normal.  Vitals reviewed.         Assessment & Plan:  Benign essential HTN - Plan: CBC with Differential/Platelet, COMPLETE METABOLIC PANEL WITH GFR, TSH  Hypothyroidism, unspecified type - Plan: TSH  Chronic daily headache  Her blood pressures well controlled. Continue Hyzaar at his current dose. While the patient is here and went to check a fasting lipid panel. I will also check a TSH to monitor her treatment for hyperthyroidism. Encouraged the patient to continue to refrain from all caffeine. If headache frequency persists or intensifies, I will start Topamax. I recommended smoking cessation

## 2015-10-27 ENCOUNTER — Ambulatory Visit: Payer: PRIVATE HEALTH INSURANCE | Admitting: Family Medicine

## 2015-10-27 LAB — CBC WITH DIFFERENTIAL/PLATELET
Basophils Absolute: 0 cells/uL (ref 0–200)
Basophils Relative: 0 %
Eosinophils Absolute: 114 cells/uL (ref 15–500)
Eosinophils Relative: 2 %
HCT: 41.5 % (ref 35.0–45.0)
Hemoglobin: 13.6 g/dL (ref 12.0–15.0)
LYMPHS PCT: 39 %
Lymphs Abs: 2223 cells/uL (ref 850–3900)
MCH: 30.6 pg (ref 27.0–33.0)
MCHC: 32.8 g/dL (ref 32.0–36.0)
MCV: 93.3 fL (ref 80.0–100.0)
MONOS PCT: 9 %
MPV: 8.5 fL (ref 7.5–12.5)
Monocytes Absolute: 513 cells/uL (ref 200–950)
Neutro Abs: 2850 cells/uL (ref 1500–7800)
Neutrophils Relative %: 50 %
PLATELETS: 253 10*3/uL (ref 140–400)
RBC: 4.45 MIL/uL (ref 3.80–5.10)
RDW: 15.1 % — AB (ref 11.0–15.0)
WBC: 5.7 10*3/uL (ref 3.8–10.8)

## 2015-12-15 ENCOUNTER — Other Ambulatory Visit: Payer: Self-pay | Admitting: Family Medicine

## 2016-01-16 ENCOUNTER — Other Ambulatory Visit: Payer: Self-pay | Admitting: Family Medicine

## 2016-01-16 NOTE — Telephone Encounter (Signed)
ok 

## 2016-01-16 NOTE — Telephone Encounter (Signed)
Medication called to pharmacy. 

## 2016-01-16 NOTE — Telephone Encounter (Signed)
Ok to refill 

## 2016-04-09 ENCOUNTER — Other Ambulatory Visit: Payer: Self-pay | Admitting: Family Medicine

## 2016-04-23 ENCOUNTER — Encounter: Payer: Self-pay | Admitting: Family Medicine

## 2016-04-23 ENCOUNTER — Ambulatory Visit (INDEPENDENT_AMBULATORY_CARE_PROVIDER_SITE_OTHER): Payer: Commercial Managed Care - PPO | Admitting: Family Medicine

## 2016-04-23 VITALS — BP 138/90 | HR 77 | Temp 98.1°F | Resp 16 | Wt 219.0 lb

## 2016-04-23 DIAGNOSIS — I714 Abdominal aortic aneurysm, without rupture, unspecified: Secondary | ICD-10-CM | POA: Insufficient documentation

## 2016-04-23 DIAGNOSIS — K2900 Acute gastritis without bleeding: Secondary | ICD-10-CM

## 2016-04-23 DIAGNOSIS — Z1231 Encounter for screening mammogram for malignant neoplasm of breast: Secondary | ICD-10-CM | POA: Diagnosis not present

## 2016-04-23 DIAGNOSIS — Z1239 Encounter for other screening for malignant neoplasm of breast: Secondary | ICD-10-CM

## 2016-04-23 DIAGNOSIS — R519 Headache, unspecified: Secondary | ICD-10-CM

## 2016-04-23 DIAGNOSIS — R51 Headache: Secondary | ICD-10-CM

## 2016-04-23 MED ORDER — TOPIRAMATE 25 MG PO TABS: 50.0000 mg | ORAL_TABLET | Freq: Two times a day (BID) | ORAL | 3 refills | Status: DC

## 2016-04-23 NOTE — Progress Notes (Signed)
Subjective:    Patient ID: [name] [name], female    DOB: 1965-02-06, 51 y.o.   MRN: [id number]  Hypertension    The patient has a long-standing history of chronic daily headache. She has been using excessive amounts of [employer] powders and BC powders for years. She also drinks excessive caffeine. Recently she developed epigastric abdominal pain radiating into her shoulder blades. She went to [facility]. Per her report a CT scan revealed a questionable finding of a 3.6 abdominal aortic aneurysm. Otherwise her lab work including troponins, CBC, CMP, lipase were normal. She reportedly had a right upper quadrant ultrasound that was normal and did not show cholelithiasis. She was diagnosed with gastritis secondary to overuse of [employer] powders and was started on ranitidine. Patient feels much better since taking the ranitidine and has not had any pain in over a week. However she is now using ibuprofen for chronic daily headache. Past Medical History:  Diagnosis Date  . AAA (abdominal aortic aneurysm) ([facility])    3.6 cm 04/2016  . Anxiety   . Cough   . Depression   . Hypertension   . Hypothyroidism   . Kidney stone   . Nicotine dependence, cigarettes, with other nicotine-induced disorders   . Nocturia   . OSA on CPAP   . Ovarian cancer ([place])   . Pneumonia   . Sleep apnea   . Thyroid disease   . Tonsillar and adenoid hypertrophy    Past Surgical History:  Procedure Laterality Date  . ABDOMINAL HYSTERECTOMY  2003  . CHOLECYSTECTOMY    . HEMORRHOID SURGERY  2007  . SEPTOPLASTY N/A 08/24/2014   Procedure: SEPTOPLASTY;  Surgeon: Jodi Marble, MD;  Location: [place];  Service: ENT;  Laterality: N/A;  . TONSILLECTOMY N/A 08/24/2014   Procedure: TONSILLECTOMY;  Surgeon: Jodi Marble, MD;  Location: [place];  Service: ENT;  Laterality: N/A;  . TUBAL LIGATION    . TURBINATE REDUCTION N/A 08/24/2014   Procedure: TURBINATE REDUCTION AND UVULECTOMY;  Surgeon: Jodi Marble, MD;  Location: [place];   Service: ENT;  Laterality: N/A;   Current Outpatient Prescriptions on File Prior to Visit  Medication Sig Dispense Refill  . ALPRAZolam (XANAX) 0.5 MG tablet TAKE ONE TABLET BY MOUTH THREE TIMES DAILY AS NEEDED FOR ANXIETY OR SLEEP 60 tablet 0  . DULoxetine (CYMBALTA) 60 MG capsule TAKE ONE CAPSULE BY MOUTH ONCE DAILY 90 capsule 1  . levothyroxine (SYNTHROID, LEVOTHROID) 112 MCG tablet TAKE ONE TABLET BY MOUTH ONCE DAILY BEFORE BREAKFAST 90 tablet 3  . losartan-hydrochlorothiazide (HYZAAR) 100-25 MG tablet Take 1 tablet by mouth daily. 90 tablet 3  . Multiple Vitamin (MULTIVITAMIN WITH MINERALS) TABS tablet Take 1 tablet by mouth daily.    . Omega-3 Fatty Acids (FISH OIL) 1000 MG CAPS Take 2,000 mg by mouth daily.      No current facility-administered medications on file prior to visit.    Allergies  Allergen Reactions  . Other Other (See Comments)    IV CONTRAST caused crazy thoughts and speech   Social History   Social History  . Marital status: Married    Spouse name: [name]  . Number of children: 1  . Years of education: 65   Occupational History  .  [place] History Main Topics  . Smoking status: Current Every Day Smoker    Packs/day: 1.00    Years: 17.00    Types: Cigarettes  . Smokeless tobacco: Never Used  . Alcohol use  No  . Drug use: No  . Sexual activity: Not on file   Other Topics Concern  . Not on file   Social History Narrative   Patient is married [name]) and lives at home with her husband.   Patient has one adult child.   Patient is working full-time.   Patient has a high school education.   Patient is left-handed.   Patient drinks a 2 liter per day.     Review of Systems  All other systems reviewed and are negative.      Objective:   Physical Exam  Constitutional: She is oriented to person, place, and time.  Cardiovascular: Normal rate and normal heart sounds.   Pulmonary/Chest: Effort normal and breath sounds normal. No  respiratory distress. She has no wheezes. She has no rales.  Neurological: She is alert and oriented to person, place, and time. She has normal reflexes. No cranial nerve deficit. She exhibits normal muscle tone. Coordination normal.  Vitals reviewed.         Assessment & Plan:  Screening for breast cancer - Plan: MM Digital Screening  Acute gastritis without hemorrhage, unspecified gastritis type  Abdominal aortic aneurysm (AAA) without rupture ([place]) - Plan: US Aorta  Chronic daily headache  Discontinue all NSAIDs. Spent more than 20 minutes with the patient going on her medical problems and explained the situation. Begin Topamax and gradually up titrate to 50 mg twice a day to prevent her chronic daily headaches. Discontinue her use of caffeine. She can use Tylenol for headache. Continue ranitidine. We'll likely continue this medication for at least 3 months and then determine if she can come off of it. Recommended smoking cessation. Blood pressure is not well controlled. Patient would like to check her blood pressure everyday for a week and notify me of the values. If persistently elevated, I will add a beta blocker. Goal systolic blood pressures less than 3:15 with a diastolic blood pressure less than 90 to prevent rupture of a AAA. Repeat ultrasound of the AAA in one year. Refer to vascular surgery if enlarging and approaching 5 cm. Recheck on headaches in one month.  Also asks me to schedule her for mammogram

## 2016-05-14 LAB — HM MAMMOGRAPHY

## 2016-05-20 ENCOUNTER — Other Ambulatory Visit: Payer: Self-pay | Admitting: Family Medicine

## 2016-05-20 DIAGNOSIS — I10 Essential (primary) hypertension: Secondary | ICD-10-CM

## 2016-05-20 MED ORDER — LOSARTAN POTASSIUM-HCTZ 100-25 MG PO TABS: 1.0000 | ORAL_TABLET | Freq: Every day | ORAL | 3 refills | Status: DC

## 2016-05-21 ENCOUNTER — Encounter: Payer: Self-pay | Admitting: Family Medicine

## 2016-05-23 ENCOUNTER — Ambulatory Visit: Payer: Commercial Managed Care - PPO | Admitting: Family Medicine

## 2016-05-28 ENCOUNTER — Encounter: Payer: Self-pay | Admitting: Family Medicine

## 2016-05-28 ENCOUNTER — Ambulatory Visit (INDEPENDENT_AMBULATORY_CARE_PROVIDER_SITE_OTHER): Payer: Commercial Managed Care - PPO | Admitting: Family Medicine

## 2016-05-28 VITALS — BP 134/88 | HR 80 | Temp 98.0°F | Resp 18 | Wt 210.0 lb

## 2016-05-28 DIAGNOSIS — I10 Essential (primary) hypertension: Secondary | ICD-10-CM

## 2016-05-28 DIAGNOSIS — I714 Abdominal aortic aneurysm, without rupture, unspecified: Secondary | ICD-10-CM

## 2016-05-28 DIAGNOSIS — R51 Headache: Secondary | ICD-10-CM

## 2016-05-28 DIAGNOSIS — R519 Headache, unspecified: Secondary | ICD-10-CM

## 2016-05-28 MED ORDER — LEVOTHYROXINE SODIUM 112 MCG PO TABS: ORAL_TABLET | ORAL | 3 refills | Status: DC

## 2016-05-28 MED ORDER — TOPIRAMATE 25 MG PO TABS: 50.0000 mg | ORAL_TABLET | Freq: Two times a day (BID) | ORAL | 3 refills | Status: DC

## 2016-05-28 MED ORDER — RANITIDINE HCL 150 MG PO TABS: 150.0000 mg | ORAL_TABLET | Freq: Every day | ORAL | 1 refills | Status: DC

## 2016-05-28 MED ORDER — ALPRAZOLAM 0.5 MG PO TABS
ORAL_TABLET | ORAL | 2 refills | Status: DC
Start: 2016-05-28 — End: 2017-12-24

## 2016-05-28 NOTE — Progress Notes (Signed)
Subjective:    Patient ID: [name] [name], female    DOB: 11/16/1965, 51 y.o.   MRN: [id number]  Hypertension   Medication Refill   04/23/16 The patient has a long-standing history of chronic daily headache. She has been using excessive amounts of Goody's powders and BC powders for years. She also drinks excessive caffeine. Recently she developed epigastric abdominal pain radiating into her shoulder blades. She went to [facility]. Per her report a CT scan revealed a questionable finding of a 3.6 abdominal aortic aneurysm. Otherwise her lab work including troponins, CBC, CMP, lipase were normal. She reportedly had a right upper quadrant ultrasound that was normal and did not show cholelithiasis. She was diagnosed with gastritis secondary to overuse of Goody powders and was started on ranitidine. Patient feels much better since taking the ranitidine and has not had any pain in over a week. However she is now using ibuprofen for chronic daily headache.  At that time, my plan was: Discontinue all NSAIDs. Spent more than 20 minutes with the patient going on her medical problems and explained the situation. Begin Topamax and gradually up titrate to 50 mg twice a day to prevent her chronic daily headaches. Discontinue her use of caffeine. She can use Tylenol for headache. Continue ranitidine. We'll likely continue this medication for at least 3 months and then determine if she can come off of it. Recommended smoking cessation. Blood pressure is not well controlled. Patient would like to check her blood pressure everyday for a week and notify me of the values. If persistently elevated, I will add a beta blocker. Goal systolic blood pressures less than 1:57 with a diastolic blood pressure less than 90 to prevent rupture of a AAA. Repeat ultrasound of the AAA in one year. Refer to vascular surgery if enlarging and approaching 5 cm. Recheck on headaches in one month.  Also asks me to schedule her for  mammogram  05/28/16 Patient states that her headaches are much better since she discontinued the Goody's powders. She is now only having 1-2 headaches a week whereas before it was a daily occurrence. She has not seen substantial benefit since starting Topamax. However she has lost 9 pounds and is trying exercise more. Unfortunately she continues to smoke. Recent mammogram was normal. Patient is due for repeat ultrasound to monitor her AAA in one year Past Medical History:  Diagnosis Date  . AAA (abdominal aortic aneurysm) (HCC)    3.6 cm 04/2016  . Anxiety   . Cough   . Depression   . Hypertension   . Hypothyroidism   . Kidney stone   . Nicotine dependence, cigarettes, with other nicotine-induced disorders   . Nocturia   . OSA on CPAP   . Ovarian cancer ([place])   . Pneumonia   . Sleep apnea   . Thyroid disease   . Tonsillar and adenoid hypertrophy    Past Surgical History:  Procedure Laterality Date  . ABDOMINAL HYSTERECTOMY  2003  . CHOLECYSTECTOMY    . HEMORRHOID SURGERY  2007  . SEPTOPLASTY N/A 08/24/2014   Procedure: SEPTOPLASTY;  Surgeon: Jodi Marble, MD;  Location: [place];  Service: ENT;  Laterality: N/A;  . TONSILLECTOMY N/A 08/24/2014   Procedure: TONSILLECTOMY;  Surgeon: Jodi Marble, MD;  Location: [place];  Service: ENT;  Laterality: N/A;  . TUBAL LIGATION    . TURBINATE REDUCTION N/A 08/24/2014   Procedure: TURBINATE REDUCTION AND UVULECTOMY;  Surgeon: Jodi Marble, MD;  Location: [place];  Service: ENT;  Laterality: N/A;   Current Outpatient Prescriptions on File Prior to Visit  Medication Sig Dispense Refill  . ALPRAZolam (XANAX) 0.5 MG tablet TAKE ONE TABLET BY MOUTH THREE TIMES DAILY AS NEEDED FOR ANXIETY OR SLEEP 60 tablet 0  . DULoxetine (CYMBALTA) 60 MG capsule TAKE ONE CAPSULE BY MOUTH ONCE DAILY 90 capsule 1  . levothyroxine (SYNTHROID, LEVOTHROID) 112 MCG tablet TAKE ONE TABLET BY MOUTH ONCE DAILY BEFORE BREAKFAST 90 tablet 3  . losartan-hydrochlorothiazide  (HYZAAR) 100-25 MG tablet Take 1 tablet by mouth daily. 90 tablet 3  . Multiple Vitamin (MULTIVITAMIN WITH MINERALS) TABS tablet Take 1 tablet by mouth daily.    . Omega-3 Fatty Acids (FISH OIL) 1000 MG CAPS Take 2,000 mg by mouth daily.     Marland Kitchen topiramate (TOPAMAX) 25 MG tablet Take 2 tablets (50 mg total) by mouth 2 (two) times daily. 120 tablet 3   No current facility-administered medications on file prior to visit.    Allergies  Allergen Reactions  . Other Other (See Comments)    IV CONTRAST caused crazy thoughts and speech   Social History   Social History  . Marital status: Married    Spouse name: [name]  . Number of children: 1  . Years of education: 8   Occupational History  .  [place] History Main Topics  . Smoking status: Current Every Day Smoker    Packs/day: 1.00    Years: 17.00    Types: Cigarettes  . Smokeless tobacco: Never Used  . Alcohol use No  . Drug use: No  . Sexual activity: Not on file   Other Topics Concern  . Not on file   Social History Narrative   Patient is married [name]) and lives at home with her husband.   Patient has one adult child.   Patient is working full-time.   Patient has a high school education.   Patient is left-handed.   Patient drinks a 2 liter per day.     Review of Systems  All other systems reviewed and are negative.      Objective:   Physical Exam  Constitutional: She is oriented to person, place, and time.  Cardiovascular: Normal rate and normal heart sounds.   Pulmonary/Chest: Effort normal and breath sounds normal. No respiratory distress. She has no wheezes. She has no rales.  Neurological: She is alert and oriented to person, place, and time. She has normal reflexes. No cranial nerve deficit. She exhibits normal muscle tone. Coordination normal.  Vitals reviewed.         Assessment & Plan:  Essential hypertension, chronic daily headache Blood pressures are down to goal. Watch blood  pressure closely. If blood pressure rises above 140/90, I will start a beta blocker. Continue to encourage the patient to quit smoking. Recheck her AAA in one year. Continue Topamax for now and reassess in one month if she feels the medication is helping prevent her headache. Hopefully we can decrease the frequency to less than one week.

## 2016-05-31 ENCOUNTER — Telehealth: Payer: Self-pay | Admitting: Family Medicine

## 2016-05-31 DIAGNOSIS — R0683 Snoring: Secondary | ICD-10-CM

## 2016-05-31 DIAGNOSIS — G4710 Hypersomnia, unspecified: Secondary | ICD-10-CM

## 2016-05-31 NOTE — Telephone Encounter (Signed)
[place] of Aorta results - Per Dr. Dennard Schaumann they do not see an aneurysm on this exam.    [facility]

## 2016-06-03 ENCOUNTER — Encounter: Payer: Self-pay | Admitting: Family Medicine

## 2016-06-04 NOTE — Telephone Encounter (Signed)
Absolutely, she is at high risk for apnea.  Please schedule sleep study

## 2016-06-04 NOTE — Telephone Encounter (Signed)
Patient aware of results and states that you had talked to her a while ago about doing a sleep study and she is wanting to know if we can order that? If so she needs it at [facility]) and needs a Mon or Wed night.

## 2016-06-05 NOTE — Telephone Encounter (Signed)
Sleep study ordered

## 2016-07-15 ENCOUNTER — Encounter: Payer: Self-pay | Admitting: Family Medicine

## 2016-07-15 ENCOUNTER — Other Ambulatory Visit: Payer: Self-pay | Admitting: Family Medicine

## 2016-07-15 MED ORDER — DULOXETINE HCL 60 MG PO CPEP: 60.0000 mg | ORAL_CAPSULE | Freq: Every day | ORAL | 1 refills | Status: DC

## 2016-07-31 ENCOUNTER — Telehealth: Payer: Self-pay | Admitting: *Deleted

## 2016-07-31 NOTE — Telephone Encounter (Signed)
Spoke with Brittney at the [facility] and she stated pt had sleep study done on 6/20 and results were faxed over.

## 2016-08-11 ENCOUNTER — Other Ambulatory Visit: Payer: Self-pay | Admitting: Family Medicine

## 2016-08-23 ENCOUNTER — Other Ambulatory Visit: Payer: Self-pay | Admitting: Family Medicine

## 2016-08-23 MED ORDER — RANITIDINE HCL 150 MG PO TABS: 150.0000 mg | ORAL_TABLET | Freq: Every day | ORAL | 3 refills | Status: DC

## 2016-08-23 NOTE — Telephone Encounter (Signed)
Medication called/sent to requested pharmacy  

## 2016-09-28 ENCOUNTER — Other Ambulatory Visit: Payer: Self-pay | Admitting: Family Medicine

## 2016-10-15 ENCOUNTER — Telehealth: Payer: Self-pay | Admitting: Family Medicine

## 2016-10-15 NOTE — Telephone Encounter (Signed)
Pt needs refill on topamax, still uses [facility] in [place]

## 2016-10-16 MED ORDER — TOPIRAMATE 25 MG PO TABS: 50.0000 mg | ORAL_TABLET | Freq: Two times a day (BID) | ORAL | 1 refills | Status: DC

## 2016-10-16 NOTE — Telephone Encounter (Signed)
rx sent to pharmacy

## 2016-11-12 ENCOUNTER — Encounter: Payer: Self-pay | Admitting: Family Medicine

## 2016-11-12 ENCOUNTER — Ambulatory Visit (INDEPENDENT_AMBULATORY_CARE_PROVIDER_SITE_OTHER): Payer: Commercial Managed Care - PPO | Admitting: Family Medicine

## 2016-11-12 VITALS — BP 128/80 | HR 90 | Temp 98.0°F | Resp 18 | Ht 63.0 in | Wt 208.0 lb

## 2016-11-12 DIAGNOSIS — K625 Hemorrhage of anus and rectum: Secondary | ICD-10-CM

## 2016-11-12 DIAGNOSIS — E039 Hypothyroidism, unspecified: Secondary | ICD-10-CM | POA: Diagnosis not present

## 2016-11-12 DIAGNOSIS — Z23 Encounter for immunization: Secondary | ICD-10-CM

## 2016-11-12 DIAGNOSIS — K5909 Other constipation: Secondary | ICD-10-CM

## 2016-11-12 NOTE — Progress Notes (Signed)
Subjective:    Patient ID: [name] [name], female    DOB: 1965/09/23, 51 y.o.   MRN: [id number]  Medication Refill   Hypertension   Patient presents with a week of bright red blood per rectum.  Previous past medical history is significant for external hemorrhoid surgery.  Patient is suffered from chronic constipation for many years.  He states that she will go 3 days without having a bowel movement.  When she does have a bowel movement, she has to stand, crawls her legs and strain in order to start the defecation process.  Once the stool begins to pass through the rectum, she can then sit down and complete the defecation.  This causes tremendous pain.  As a result over the last few days whenever she wipes after defecation, she is seeing bright red blood per rectum and is also having painless bright red blood in her underwear staining her undergarments without defecation.  Rectal exam is performed today and reveals numerous skin tags around the rectum from previous external hemorrhoids but no active external hemorrhoid and no obvious source of bleeding.  There is no palpable rectal mass.  Rectal tone is normal.  There is no visible rectal fissure.  There is no palpable internal rectal mass. Past Medical History:  Diagnosis Date  . AAA (abdominal aortic aneurysm) (HCC)    3.6 cm 04/2016  . Anxiety   . Cough   . Depression   . Hypertension   . Hypothyroidism   . Kidney stone   . Nicotine dependence, cigarettes, with other nicotine-induced disorders   . Nocturia   . OSA on CPAP    AHI 49.3, cpap at 10 cm H2O (07/05/16)  . Ovarian cancer ([place])   . Pneumonia   . Sleep apnea   . Thyroid disease   . Tonsillar and adenoid hypertrophy    Past Surgical History:  Procedure Laterality Date  . ABDOMINAL HYSTERECTOMY  2003  . CHOLECYSTECTOMY    . HEMORRHOID SURGERY  2007  . SEPTOPLASTY N/A 08/24/2014   Procedure: SEPTOPLASTY;  Surgeon: Jodi Marble, MD;  Location: [place];  Service: ENT;  Laterality:  N/A;  . TONSILLECTOMY N/A 08/24/2014   Procedure: TONSILLECTOMY;  Surgeon: Jodi Marble, MD;  Location: [place];  Service: ENT;  Laterality: N/A;  . TUBAL LIGATION    . TURBINATE REDUCTION N/A 08/24/2014   Procedure: TURBINATE REDUCTION AND UVULECTOMY;  Surgeon: Jodi Marble, MD;  Location: [place];  Service: ENT;  Laterality: N/A;   Current Outpatient Prescriptions on File Prior to Visit  Medication Sig Dispense Refill  . ALPRAZolam (XANAX) 0.5 MG tablet TAKE ONE TABLET BY MOUTH THREE TIMES DAILY AS NEEDED FOR ANXIETY OR SLEEP 60 tablet 2  . DULoxetine (CYMBALTA) 60 MG capsule Take 1 capsule (60 mg total) by mouth daily. 90 capsule 1  . levothyroxine (SYNTHROID, LEVOTHROID) 112 MCG tablet TAKE ONE TABLET BY MOUTH ONCE DAILY BEFORE BREAKFAST 90 tablet 3  . losartan-hydrochlorothiazide (HYZAAR) 100-25 MG tablet Take 1 tablet by mouth daily. 90 tablet 3  . Multiple Vitamin (MULTIVITAMIN WITH MINERALS) TABS tablet Take 1 tablet by mouth daily.    . Omega-3 Fatty Acids (FISH OIL) 1000 MG CAPS Take 2,000 mg by mouth daily.     . ranitidine (ZANTAC) 150 MG tablet Take 1 tablet (150 mg total) by mouth at bedtime. 90 tablet 3  . topiramate (TOPAMAX) 25 MG tablet Take 2 tablets (50 mg total) by mouth 2 (two) times daily. 120 tablet 1  No current facility-administered medications on file prior to visit.    Allergies  Allergen Reactions  . Other Other (See Comments)    IV CONTRAST caused crazy thoughts and speech   Social History   Social History  . Marital status: Married    Spouse name: [name] [name]  . Number of children: 1  . Years of education: 44   Occupational History  .  [place] History Main Topics  . Smoking status: Current Every Day Smoker    Packs/day: 1.00    Years: 17.00    Types: Cigarettes  . Smokeless tobacco: Never Used  . Alcohol use No  . Drug use: No  . Sexual activity: Not on file   Other Topics Concern  . Not on file   Social History Narrative    Patient is married [name] [name]) and lives at home with her husband.   Patient has one adult child.   Patient is working full-time.   Patient has a high school education.   Patient is left-handed.   Patient drinks a 2 liter per day.     Review of Systems  All other systems reviewed and are negative.      Objective:   Physical Exam  Constitutional: She is oriented to person, place, and time.  Cardiovascular: Normal rate and normal heart sounds.   Pulmonary/Chest: Effort normal and breath sounds normal. No respiratory distress. She has no wheezes. She has no rales.  Abdominal: Soft. Bowel sounds are normal. She exhibits no distension and no mass. There is no tenderness. There is no rebound and no guarding.  Genitourinary: Rectal exam shows external hemorrhoid. Rectal exam shows no fissure, no mass, no tenderness and anal tone normal.  Neurological: She is alert and oriented to person, place, and time. She has normal reflexes. No cranial nerve deficit. She exhibits normal muscle tone. Coordination normal.  Vitals reviewed.         Assessment & Plan:  Hypothyroidism, unspecified type - Plan: CBC with Differential/Platelet, COMPLETE METABOLIC PANEL WITH GFR, TSH  Need for diphtheria-tetanus-pertussis (Tdap) vaccine - Plan: Tdap vaccine greater than or equal to 7yo IM  Bright red blood per rectum - Plan: Ambulatory referral to Gastroenterology  Chronic constipation - Plan: Ambulatory referral to Gastroenterology  I believe the bright red blood per rectum is likely due to damage secondary to chronic constipation and straining with defecation either due to bleeding internal hemorrhoids or possibly diverticulosis.  To manage this, I believe we must first manage the chronic constipation.  Therefore I will start the patient on linzess 145 mcg poqday and reassess in 2 weeks.  I will check a CBC to rule out significant blood loss.  While the patient is here, I will also check a CMP as well as a  TSH regarding her hypothyroidism.  Given her proximity to age 36, the bright red blood per rectum, and her chronic constipation, I will also consult GI for colonoscopy to rule out other causes of bright red blood per rectum.

## 2016-11-14 LAB — CBC WITH DIFFERENTIAL/PLATELET
BASOS ABS: 31 {cells}/uL (ref 0–200)
Basophils Relative: 0.5 %
EOS ABS: 79 {cells}/uL (ref 15–500)
Eosinophils Relative: 1.3 %
HEMATOCRIT: 37.6 % (ref 35.0–45.0)
HEMOGLOBIN: 13.1 g/dL (ref 11.7–15.5)
LYMPHS ABS: 2891 {cells}/uL (ref 850–3900)
MCH: 30.3 pg (ref 27.0–33.0)
MCHC: 34.8 g/dL (ref 32.0–36.0)
MCV: 87 fL (ref 80.0–100.0)
MONOS PCT: 5.8 %
MPV: 9.5 fL (ref 7.5–12.5)
NEUTROS ABS: 2745 {cells}/uL (ref 1500–7800)
Neutrophils Relative %: 45 %
Platelets: 273 10*3/uL (ref 140–400)
RBC: 4.32 10*6/uL (ref 3.80–5.10)
RDW: 13.9 % (ref 11.0–15.0)
Total Lymphocyte: 47.4 %
WBC: 6.1 10*3/uL (ref 3.8–10.8)
WBCMIX: 354 {cells}/uL (ref 200–950)

## 2016-11-14 LAB — COMPLETE METABOLIC PANEL WITH GFR

## 2016-11-14 LAB — TSH: TSH: 3.3 mIU/L

## 2016-11-15 ENCOUNTER — Encounter (INDEPENDENT_AMBULATORY_CARE_PROVIDER_SITE_OTHER): Payer: Self-pay | Admitting: Internal Medicine

## 2016-11-15 ENCOUNTER — Encounter (INDEPENDENT_AMBULATORY_CARE_PROVIDER_SITE_OTHER): Payer: Self-pay

## 2016-11-28 ENCOUNTER — Telehealth (INDEPENDENT_AMBULATORY_CARE_PROVIDER_SITE_OTHER): Payer: Self-pay | Admitting: *Deleted

## 2016-11-28 ENCOUNTER — Encounter (INDEPENDENT_AMBULATORY_CARE_PROVIDER_SITE_OTHER): Payer: Self-pay | Admitting: *Deleted

## 2016-11-28 ENCOUNTER — Encounter (INDEPENDENT_AMBULATORY_CARE_PROVIDER_SITE_OTHER): Payer: Self-pay | Admitting: Internal Medicine

## 2016-11-28 ENCOUNTER — Ambulatory Visit (INDEPENDENT_AMBULATORY_CARE_PROVIDER_SITE_OTHER): Payer: PRIVATE HEALTH INSURANCE | Admitting: Internal Medicine

## 2016-11-28 ENCOUNTER — Other Ambulatory Visit (INDEPENDENT_AMBULATORY_CARE_PROVIDER_SITE_OTHER): Payer: Self-pay | Admitting: Internal Medicine

## 2016-11-28 VITALS — BP 120/80 | HR 88 | Temp 98.5°F | Ht 63.0 in | Wt 210.2 lb

## 2016-11-28 DIAGNOSIS — K625 Hemorrhage of anus and rectum: Secondary | ICD-10-CM

## 2016-11-28 DIAGNOSIS — E039 Hypothyroidism, unspecified: Secondary | ICD-10-CM

## 2016-11-28 DIAGNOSIS — K640 First degree hemorrhoids: Secondary | ICD-10-CM

## 2016-11-28 HISTORY — DX: Hypothyroidism, unspecified: E03.9

## 2016-11-28 MED ORDER — PEG 3350-KCL-NA BICARB-NACL 420 G PO SOLR: 4000.0000 mL | Freq: Once | ORAL | 0 refills | Status: AC

## 2016-11-28 NOTE — Progress Notes (Signed)
Subjective:    Patient ID: [name] [name], female    DOB: Aug 27, 1965, 51 y.o.   MRN: [id number]  HPI Referred by Dr Dennard Schaumann for rectal bleeding.  Rectal bleeding for a month. States 7 yrs she had hemorrhoid surgery by Dr Romona Curls (external). She states she has hard stools. In the past month, she has to strain to have a BM. She has a BM daily or sometimes once a week.  She is not taking any stool softeners at this time.  She says DR. Pickard gave her LInzess but it did not help. She has never undergone a colonoscopy in the past Dr Dennard Schaumann performed a rectal exam and no masses were felt and stool was guaiac negative.  Her appetite is good. No weight loss.  No abdominal pain.  Hx of external hemorrhoid surgery by Dr. Romona Curls. Hx of chronic constipation,.  No family hx of colon cancer.  Review of Systems Past Medical History:  Diagnosis Date  . AAA (abdominal aortic aneurysm) (HCC)    3.6 cm 04/2016  . Anxiety   . Cough   . Depression   . Hypertension   . Hypothyroid 11/28/2016  . Hypothyroidism   . Kidney stone   . Nicotine dependence, cigarettes, with other nicotine-induced disorders   . Nocturia   . OSA on CPAP    AHI 49.3, cpap at 10 cm H2O (07/05/16)  . Ovarian cancer ([place])   . Pneumonia   . Sleep apnea   . Thyroid disease   . Tonsillar and adenoid hypertrophy     Past Surgical History:  Procedure Laterality Date  . ABDOMINAL HYSTERECTOMY  2003  . CHOLECYSTECTOMY    . HEMORRHOID SURGERY  2007  . SEPTOPLASTY N/A 08/24/2014   Procedure: SEPTOPLASTY;  Surgeon: Jodi Marble, MD;  Location: [place];  Service: ENT;  Laterality: N/A;  . TONSILLECTOMY N/A 08/24/2014   Procedure: TONSILLECTOMY;  Surgeon: Jodi Marble, MD;  Location: [place];  Service: ENT;  Laterality: N/A;  . TUBAL LIGATION    . TURBINATE REDUCTION N/A 08/24/2014   Procedure: TURBINATE REDUCTION AND UVULECTOMY;  Surgeon: Jodi Marble, MD;  Location: [place];  Service: ENT;  Laterality: N/A;    Allergies  Allergen  Reactions  . Other Other (See Comments)    IV CONTRAST caused crazy thoughts and speech    Current Outpatient Medications on File Prior to Visit  Medication Sig Dispense Refill  . ALPRAZolam (XANAX) 0.5 MG tablet TAKE ONE TABLET BY MOUTH THREE TIMES DAILY AS NEEDED FOR ANXIETY OR SLEEP 60 tablet 2  . DULoxetine (CYMBALTA) 60 MG capsule Take 1 capsule (60 mg total) by mouth daily. 90 capsule 1  . levothyroxine (SYNTHROID, LEVOTHROID) 112 MCG tablet TAKE ONE TABLET BY MOUTH ONCE DAILY BEFORE BREAKFAST 90 tablet 3  . losartan-hydrochlorothiazide (HYZAAR) 100-25 MG tablet Take 1 tablet by mouth daily. 90 tablet 3  . Multiple Vitamin (MULTIVITAMIN WITH MINERALS) TABS tablet Take 1 tablet by mouth daily.    . Omega-3 Fatty Acids (FISH OIL) 1000 MG CAPS Take 2,000 mg by mouth daily.     . ranitidine (ZANTAC) 150 MG tablet Take 1 tablet (150 mg total) by mouth at bedtime. 90 tablet 3  . topiramate (TOPAMAX) 25 MG tablet Take 2 tablets (50 mg total) by mouth 2 (two) times daily. 120 tablet 1   No current facility-administered medications on file prior to visit.         Objective:   Physical Exam Blood pressure 120/80, pulse  88, temperature 98.5 F (36.9 C), height 5\' 3"  (1.6 m), weight 210 lb 3.2 oz (95.3 kg). Alert and oriented. Skin warm and dry. Oral mucosa is moist.   . Sclera anicteric, conjunctivae is pink. Thyroid not enlarged. No cervical lymphadenopathy. Lungs clear. Heart regular rate and rhythm.  Abdomen is soft. Bowel sounds are positive. No hepatomegaly. No abdominal masses felt. No tenderness.  No edema to lower extremities.  Stool brown and guaiac negative.         Assessment & Plan:  Constipation: am going to start her on Amitiza 37mcg BID. Colonoscopy to rule out internal hemorrhoids, polyp, colon cancer.,

## 2016-11-28 NOTE — Patient Instructions (Addendum)
The risks of bleeding, perforation and infection were reviewed with patient. Sample of Amitiza 5mcg bid given to patient.

## 2016-11-28 NOTE — Telephone Encounter (Signed)
Patient needs trilyte 

## 2016-11-29 DIAGNOSIS — K640 First degree hemorrhoids: Secondary | ICD-10-CM | POA: Insufficient documentation

## 2016-11-29 DIAGNOSIS — K625 Hemorrhage of anus and rectum: Secondary | ICD-10-CM | POA: Insufficient documentation

## 2016-12-12 ENCOUNTER — Other Ambulatory Visit: Payer: Self-pay | Admitting: Family Medicine

## 2016-12-27 ENCOUNTER — Other Ambulatory Visit: Payer: Self-pay

## 2016-12-27 ENCOUNTER — Encounter (HOSPITAL_COMMUNITY): Payer: Self-pay | Admitting: *Deleted

## 2016-12-27 ENCOUNTER — Encounter (HOSPITAL_COMMUNITY)
Admission: RE | Disposition: A | Discharge status: Home / Self Care | Payer: Self-pay | Source: Ambulatory Visit | Attending: Internal Medicine

## 2016-12-27 ENCOUNTER — Ambulatory Visit (HOSPITAL_COMMUNITY)
Admission: RE | Admit: 2016-12-27 | Discharge: 2016-12-27 | Disposition: A | Discharge status: Home / Self Care | Payer: Commercial Managed Care - PPO | Source: Ambulatory Visit | Attending: Internal Medicine | Admitting: Internal Medicine

## 2016-12-27 DIAGNOSIS — K635 Polyp of colon: Secondary | ICD-10-CM | POA: Insufficient documentation

## 2016-12-27 DIAGNOSIS — G4733 Obstructive sleep apnea (adult) (pediatric): Secondary | ICD-10-CM | POA: Insufficient documentation

## 2016-12-27 DIAGNOSIS — Z8543 Personal history of malignant neoplasm of ovary: Secondary | ICD-10-CM | POA: Diagnosis not present

## 2016-12-27 DIAGNOSIS — Z9989 Dependence on other enabling machines and devices: Secondary | ICD-10-CM | POA: Diagnosis not present

## 2016-12-27 DIAGNOSIS — D125 Benign neoplasm of sigmoid colon: Secondary | ICD-10-CM | POA: Diagnosis not present

## 2016-12-27 DIAGNOSIS — Q438 Other specified congenital malformations of intestine: Secondary | ICD-10-CM | POA: Diagnosis not present

## 2016-12-27 DIAGNOSIS — E039 Hypothyroidism, unspecified: Secondary | ICD-10-CM | POA: Insufficient documentation

## 2016-12-27 DIAGNOSIS — F419 Anxiety disorder, unspecified: Secondary | ICD-10-CM | POA: Diagnosis not present

## 2016-12-27 DIAGNOSIS — K921 Melena: Secondary | ICD-10-CM | POA: Insufficient documentation

## 2016-12-27 DIAGNOSIS — I10 Essential (primary) hypertension: Secondary | ICD-10-CM | POA: Diagnosis not present

## 2016-12-27 DIAGNOSIS — Z87442 Personal history of urinary calculi: Secondary | ICD-10-CM | POA: Insufficient documentation

## 2016-12-27 DIAGNOSIS — Z79899 Other long term (current) drug therapy: Secondary | ICD-10-CM | POA: Diagnosis not present

## 2016-12-27 DIAGNOSIS — K644 Residual hemorrhoidal skin tags: Secondary | ICD-10-CM | POA: Diagnosis not present

## 2016-12-27 DIAGNOSIS — K640 First degree hemorrhoids: Secondary | ICD-10-CM

## 2016-12-27 DIAGNOSIS — I714 Abdominal aortic aneurysm, without rupture: Secondary | ICD-10-CM | POA: Insufficient documentation

## 2016-12-27 DIAGNOSIS — F329 Major depressive disorder, single episode, unspecified: Secondary | ICD-10-CM | POA: Diagnosis not present

## 2016-12-27 DIAGNOSIS — Z91041 Radiographic dye allergy status: Secondary | ICD-10-CM | POA: Diagnosis not present

## 2016-12-27 DIAGNOSIS — F17210 Nicotine dependence, cigarettes, uncomplicated: Secondary | ICD-10-CM | POA: Insufficient documentation

## 2016-12-27 DIAGNOSIS — K625 Hemorrhage of anus and rectum: Secondary | ICD-10-CM

## 2016-12-27 HISTORY — PX: COLONOSCOPY: SHX5424

## 2016-12-27 SURGERY — COLONOSCOPY
Anesthesia: Moderate Sedation

## 2016-12-27 MED ORDER — MIDAZOLAM HCL 5 MG/5ML IJ SOLN
INTRAMUSCULAR | Status: DC | PRN
  Administered 2016-12-27: 2 mg via INTRAVENOUS
  Administered 2016-12-27: 1 mg via INTRAVENOUS
  Administered 2016-12-27 (×2): 2 mg via INTRAVENOUS

## 2016-12-27 MED ORDER — SODIUM CHLORIDE 0.9 % IV SOLN
INTRAVENOUS | Status: DC
  Administered 2016-12-27: 13:00:00 via INTRAVENOUS

## 2016-12-27 MED ORDER — MIDAZOLAM HCL 5 MG/5ML IJ SOLN
INTRAMUSCULAR | Status: AC
  Filled 2016-12-27: qty 10

## 2016-12-27 MED ORDER — MEPERIDINE HCL 50 MG/ML IJ SOLN
INTRAMUSCULAR | Status: AC
  Filled 2016-12-27: qty 1

## 2016-12-27 MED ORDER — STERILE WATER FOR IRRIGATION IR SOLN
Status: DC | PRN
  Administered 2016-12-27: 2.5 mL

## 2016-12-27 MED ORDER — MEPERIDINE HCL 50 MG/ML IJ SOLN
INTRAMUSCULAR | Status: DC | PRN
  Administered 2016-12-27 (×2): 25 mg via INTRAVENOUS

## 2016-12-27 NOTE — H&P (Signed)
[name] [name] is an 51 y.o. female.   Chief Complaint: Patient is here for colonoscopy. HPI: Was been experiencing rectal bleeding for the last couple of months.  This occurs with her bowel movements.  She is prone to constipation.  She passes anywhere from small to moderate to large amount of blood.  She denies weight loss.  She states she passed out 3 times yesterday while standing.  She feels she got dehydrated.  Her husband wanted her to come to emergency room but she decided not to.  She feels fine today except she has pain in the coccygeal region. Family history is negative for CRC or IBD.  Past Medical History:  Diagnosis Date  . AAA (abdominal aortic aneurysm) (HCC)    3.6 cm 04/2016  . Anxiety       . Depression   . Hypertension   .  11/28/2016  . Hypothyroidism   . Kidney stone   . Nicotine dependence, cigarettes, with other nicotine-induced disorders   . Nocturia   . OSA on CPAP    AHI 49.3, cpap at 10 cm H2O (07/05/16)  . Ovarian cancer ([place])   . Pneumonia   . Sleep apnea       . Tonsillar and adenoid hypertrophy     Past Surgical History:  Procedure Laterality Date  . ABDOMINAL HYSTERECTOMY  2003  . CHOLECYSTECTOMY    . HEMORRHOID SURGERY  2007  . SEPTOPLASTY N/A 08/24/2014   Procedure: SEPTOPLASTY;  Surgeon: Jodi Marble, MD;  Location: [place];  Service: ENT;  Laterality: N/A;  . TONSILLECTOMY N/A 08/24/2014   Procedure: TONSILLECTOMY;  Surgeon: Jodi Marble, MD;  Location: [place];  Service: ENT;  Laterality: N/A;  . TUBAL LIGATION    . TURBINATE REDUCTION N/A 08/24/2014   Procedure: TURBINATE REDUCTION AND UVULECTOMY;  Surgeon: Jodi Marble, MD;  Location: [facility];  Service: ENT;  Laterality: N/A;    Family History  Problem Relation Age of Onset  . Parkinson's disease Mother   . Heart Problems Father   . Thyroid disease Maternal Grandmother   . Lymphoma Maternal Grandmother   . Colon cancer Neg Hx    Social History:  reports that she has been smoking  cigarettes.  She has a 17.00 pack-year smoking history. she has never used smokeless tobacco. She reports that she does not drink alcohol or use drugs.  Allergies:  Allergies  Allergen Reactions  . Other Other (See Comments)    IV CONTRAST caused crazy thoughts and speech    Medications Prior to Admission  Medication Sig Dispense Refill  . ALPRAZolam (XANAX) 0.5 MG tablet TAKE ONE TABLET BY MOUTH THREE TIMES DAILY AS NEEDED FOR ANXIETY OR SLEEP (Patient taking differently: Take 0.5 mg by mouth at bedtime as needed for sleep. ) 60 tablet 2  . DULoxetine (CYMBALTA) 60 MG capsule Take 1 capsule (60 mg total) by mouth daily. 90 capsule 1  . ibuprofen (ADVIL,MOTRIN) 200 MG tablet Take 800 mg by mouth every 8 (eight) hours as needed for headache.    . levothyroxine (SYNTHROID, LEVOTHROID) 112 MCG tablet TAKE ONE TABLET BY MOUTH ONCE DAILY BEFORE BREAKFAST (Patient taking differently: Take 112 mcg by mouth daily before breakfast. TAKE ONE TABLET BY MOUTH ONCE DAILY BEFORE BREAKFAST) 90 tablet 3  . losartan-hydrochlorothiazide (HYZAAR) 100-25 MG tablet Take 1 tablet by mouth daily. 90 tablet 3  . Multiple Vitamin (MULTIVITAMIN WITH MINERALS) TABS tablet Take 1 tablet by mouth daily.    . Omega-3 Fatty  Acids (FISH OIL) 1000 MG CAPS Take 2,000 mg by mouth daily.     . ranitidine (ZANTAC) 150 MG tablet Take 1 tablet (150 mg total) by mouth at bedtime. 90 tablet 3  . topiramate (TOPAMAX) 25 MG tablet Take 2 tablets (50 mg total) by mouth 2 (two) times daily. 120 tablet 1  . topiramate (TOPAMAX) 25 MG tablet TAKE TWO TABLETS BY MOUTH TWICE DAILY (Patient not taking: Reported on 12/26/2016) 120 tablet 1    No results found for this or any previous visit (from the past 48 hour(s)). No results found.  ROS  Blood pressure 124/65, pulse 73, temperature 98.7 F (37.1 C), temperature source Oral, resp. rate 18, height 5\' 3"  (1.6 m), weight 211 lb (95.7 kg), SpO2 100 %. Physical Exam  Constitutional: She  appears well-developed and well-nourished.  HENT:  Mouth/Throat: Oropharynx is clear and moist.  Eyes: Conjunctivae are normal. No scleral icterus.  Neck: No thyromegaly present.  Cardiovascular: Normal rate, regular rhythm and normal heart sounds.  No murmur heard. Respiratory: Effort normal and breath sounds normal.  GI:  Abdomen is full.  It is soft and nontender without organomegaly or masses.  Musculoskeletal: She exhibits no edema.  Lymphadenopathy:    She has no cervical adenopathy.  Neurological: She is alert.  Skin: Skin is warm and dry.     Assessment/Plan Rectal bleeding. Diagnostic colonoscopy.  Hildred Laser, MD 12/27/2016, 2:16 PM

## 2016-12-27 NOTE — Discharge Instructions (Signed)
Resume usual medications and high fiber diet. Benefiber or equivalent 4 g p.o. Nightly. No driving for 24 hours. Physician will call with biopsy results.   Colonoscopy, Adult, Care After This sheet gives you information about how to care for yourself after your procedure. Your health care provider may also give you more specific instructions. If you have problems or questions, contact your health care provider. What can I expect after the procedure? After the procedure, it is common to have:  A small amount of blood in your stool for 24 hours after the procedure.  Some gas.  Mild abdominal cramping or bloating.  Follow these instructions at home: General instructions   For the first 24 hours after the procedure: ? Do not drive or use machinery. ? Do not sign important documents. ? Do not drink alcohol. ? Do your regular daily activities at a slower pace than normal. ? Eat soft, easy-to-digest foods. ? Rest often.  Take over-the-counter or prescription medicines only as told by your health care provider.  It is up to you to get the results of your procedure. Ask your health care provider, or the department performing the procedure, when your results will be ready. Relieving cramping and bloating  Try walking around when you have cramps or feel bloated.  Apply heat to your abdomen as told by your health care provider. Use a heat source that your health care provider recommends, such as a moist heat pack or a heating pad. ? Place a towel between your skin and the heat source. ? Leave the heat on for 20-30 minutes. ? Remove the heat if your skin turns bright red. This is especially important if you are unable to feel pain, heat, or cold. You may have a greater risk of getting burned. Eating and drinking  Drink enough fluid to keep your urine clear or pale yellow.  Resume your normal diet as instructed by your health care provider. Avoid heavy or fried foods that are hard to  digest.  Avoid drinking alcohol for as long as instructed by your health care provider. Contact a health care provider if:  You have blood in your stool 2-3 days after the procedure. Get help right away if:  You have more than a small spotting of blood in your stool.  You pass large blood clots in your stool.  Your abdomen is swollen.  You have nausea or vomiting.  You have a fever.  You have increasing abdominal pain that is not relieved with medicine. This information is not intended to replace advice given to you by your health care provider. Make sure you discuss any questions you have with your health care provider. Document Released: 08/15/2003 Document Revised: 09/25/2015 Document Reviewed: 03/14/2015 Elsevier Interactive Patient Education  2018 [place].   High-Fiber Diet Fiber, also called dietary fiber, is a type of carbohydrate found in fruits, vegetables, whole grains, and beans. A high-fiber diet can have many health benefits. Your health care provider may recommend a high-fiber diet to help: Prevent constipation. Fiber can make your bowel movements more regular. Lower your cholesterol. Relieve hemorrhoids, uncomplicated diverticulosis, or irritable bowel syndrome. Prevent overeating as part of a weight-loss plan. Prevent heart disease, type 2 diabetes, and certain cancers.  What is my plan? The recommended daily intake of fiber includes: 38 grams for men under age 14. 58 grams for men over age 52. 49 grams for women under age 60. 73 grams for women over age 59.  You can get  the recommended daily intake of dietary fiber by eating a variety of fruits, vegetables, grains, and beans. Your health care provider may also recommend a fiber supplement if it is not possible to get enough fiber through your diet. What do I need to know about a high-fiber diet? Fiber supplements have not been widely studied for their effectiveness, so it is better to get fiber through  food sources. Always check the fiber content on thenutrition facts label of any prepackaged food. Look for foods that contain at least 5 grams of fiber per serving. Ask your dietitian if you have questions about specific foods that are related to your condition, especially if those foods are not listed in the following section. Increase your daily fiber consumption gradually. Increasing your intake of dietary fiber too quickly may cause bloating, cramping, or gas. Drink plenty of water. Water helps you to digest fiber. What foods can I eat? Grains Whole-grain breads. Multigrain cereal. Oats and oatmeal. Brown rice. Barley. Bulgur wheat. [place]. Bran muffins. Popcorn. Rye wafer crackers. Vegetables Sweet potatoes. Spinach. Kale. Artichokes. Cabbage. Broccoli. Green peas. Carrots. Squash. Fruits Berries. Pears. Apples. Oranges. Avocados. Prunes and raisins. Dried figs. Meats and Other Protein Sources [place], kidney, pinto, and soy beans. Split peas. Lentils. Nuts and seeds. Dairy Fiber-fortified yogurt. Beverages Fiber-fortified soy milk. Fiber-fortified orange juice. Other Fiber bars. The items listed above may not be a complete list of recommended foods or beverages. Contact your dietitian for more options. What foods are not recommended? Grains White bread. Pasta made with refined flour. White rice. Vegetables Fried potatoes. Canned vegetables. Well-cooked vegetables. Fruits Fruit juice. Cooked, strained fruit. Meats and Other Protein Sources Fatty cuts of meat. Fried Sales executive or fried fish. Dairy Milk. Yogurt. Cream cheese. Sour cream. Beverages Soft drinks. Other Cakes and pastries. Butter and oils. The items listed above may not be a complete list of foods and beverages to avoid. Contact your dietitian for more information. What are some tips for including high-fiber foods in my diet? Eat a wide variety of high-fiber foods. Make sure that half of all grains consumed each day  are whole grains. Replace breads and cereals made from refined flour or white flour with whole-grain breads and cereals. Replace white rice with brown rice, bulgur wheat, or millet. Start the day with a breakfast that is high in fiber, such as a cereal that contains at least 5 grams of fiber per serving. Use beans in place of meat in soups, salads, or pasta. Eat high-fiber snacks, such as berries, raw vegetables, nuts, or popcorn. This information is not intended to replace advice given to you by your health care provider. Make sure you discuss any questions you have with your health care provider. Document Released: 12/31/2004 Document Revised: 06/08/2015 Document Reviewed: 06/15/2013 Elsevier Interactive Patient Education  2017 [employer].

## 2016-12-27 NOTE — Op Note (Signed)
[facility] Patient Name: [name] [name] Procedure Date: 12/27/2016 1:59 PM MRN: [id number] Date of Birth: 1965-10-01 Attending MD: Hildred Laser , MD CSN: [id number] Age: 51 Admit Type: Outpatient Procedure:                Colonoscopy Indications:              Hematochezia Providers:                Hildred Laser, MD, Lurline Del, RN, Purcell Nails. [place],                            Merchant navy officer Referring MD:             Cammie Mcgee. Pickard, MD Medicines:                Meperidine 50 mg IV, Midazolam 7 mg IV Complications:            No immediate complications. Estimated Blood Loss:     Estimated blood loss was minimal. Procedure:                Pre-Anesthesia Assessment:                           - Prior to the procedure, a History and Physical                            was performed, and patient medications and                            allergies were reviewed. The patient's tolerance of                            previous anesthesia was also reviewed. The risks                            and benefits of the procedure and the sedation                            options and risks were discussed with the patient.                            All questions were answered, and informed consent                            was obtained. Prior Anticoagulants: The patient                            last took ibuprofen 7 days prior to the procedure.                            ASA Grade Assessment: II - A patient with mild                            systemic disease. After reviewing the risks and  benefits, the patient was deemed in satisfactory                            condition to undergo the procedure.                           After obtaining informed consent, the colonoscope                            was passed under direct vision. Throughout the                            procedure, the patient's blood pressure, pulse, and                            oxygen saturations  were monitored continuously. The                            [id number] ([id number]) scope was introduced through                            the anus and advanced to the the cecum, identified                            by appendiceal orifice and ileocecal valve. The                            colonoscopy was somewhat difficult due to a                            tortuous colon. Successful completion of the                            procedure was aided by changing the patient's                            position, using manual pressure and withdrawing and                            reinserting the scope. The patient tolerated the                            procedure well. The quality of the bowel                            preparation was adequate. The ileocecal valve,                            appendiceal orifice, and rectum were photographed. Scope In: 2:26:20 PM Scope Out: 3:00:37 PM Scope Withdrawal Time: 0 hours 10 minutes 41 seconds  Total Procedure Duration: 0 hours 34 minutes 17 seconds  Findings:      The perianal and digital rectal examinations were normal.      Two sessile polyps were found in the proximal  sigmoid colon. The polyps       were small in size. These polyps were removed with a cold snare.       Resection and retrieval were complete. The pathology specimen was placed       into Bottle Number 1.      The exam was otherwise normal throughout the examined colon.      External hemorrhoids were found during retroflexion. The hemorrhoids       were small. Impression:               - Two small polyps in the proximal sigmoid colon,                            removed with a cold snare. Resected and retrieved.                           - External hemorrhoids. Moderate Sedation:      Moderate (conscious) sedation was administered by the endoscopy nurse       and supervised by the endoscopist. The following parameters were       monitored: oxygen saturation, heart rate, blood  pressure, CO2       capnography and response to care. Total physician intraservice time was       39 minutes. Recommendation:           - Patient has a contact number available for                            emergencies. The signs and symptoms of potential                            delayed complications were discussed with the                            patient. Return to normal activities tomorrow.                            Written discharge instructions were provided to the                            patient.                           - High fiber diet today.                           - Continue present medications.                           - Await pathology results.                           - Repeat colonoscopy for surveillance based on                            pathology results.                           -  No aspirin, ibuprofen, naproxen, or other                            non-steroidal anti-inflammatory drugs for 1 day. Procedure Code(s):        --- Professional ---                           [phone], Colonoscopy, flexible; with removal of                            tumor(s), polyp(s), or other lesion(s) by snare                            technique                           99152, Moderate sedation services provided by the                            same physician or other qualified health care                            professional performing the diagnostic or                            therapeutic service that the sedation supports,                            requiring the presence of an independent trained                            observer to assist in the monitoring of the                            patient's level of consciousness and physiological                            status; initial 15 minutes of intraservice time,                            patient age 27 years or older                           [phone], Moderate sedation services; each additional                             15 minutes intraservice time                           99153, Moderate sedation services; each additional                            15 minutes intraservice time Diagnosis Code(s):        --- Professional ---  D12.5, Benign neoplasm of sigmoid colon                           K64.4, Residual hemorrhoidal skin tags                           K92.1, Melena (includes Hematochezia) CPT copyright 2016 American Medical Association. All rights reserved. The codes documented in this report are preliminary and upon coder review may  be revised to meet current compliance requirements. Hildred Laser, MD Hildred Laser, MD 12/27/2016 3:11:51 PM This report has been signed electronically. Number of Addenda: 0

## 2016-12-30 ENCOUNTER — Encounter (HOSPITAL_COMMUNITY): Payer: Self-pay | Admitting: Internal Medicine

## 2017-01-02 ENCOUNTER — Other Ambulatory Visit: Payer: Self-pay | Admitting: Family Medicine

## 2017-02-07 ENCOUNTER — Other Ambulatory Visit: Payer: Self-pay | Admitting: Family Medicine

## 2017-02-07 MED ORDER — TOPIRAMATE 25 MG PO TABS: 50.0000 mg | ORAL_TABLET | Freq: Two times a day (BID) | ORAL | 1 refills | Status: DC

## 2017-05-04 ENCOUNTER — Other Ambulatory Visit: Payer: Self-pay | Admitting: Family Medicine

## 2017-05-04 DIAGNOSIS — I10 Essential (primary) hypertension: Secondary | ICD-10-CM

## 2017-05-07 ENCOUNTER — Other Ambulatory Visit: Payer: Self-pay | Admitting: Family Medicine

## 2017-05-07 DIAGNOSIS — I10 Essential (primary) hypertension: Secondary | ICD-10-CM

## 2017-05-07 MED ORDER — TOPIRAMATE 25 MG PO TABS: 50.0000 mg | ORAL_TABLET | Freq: Two times a day (BID) | ORAL | 1 refills | Status: DC

## 2017-05-07 MED ORDER — RANITIDINE HCL 150 MG PO TABS: 150.0000 mg | ORAL_TABLET | Freq: Every day | ORAL | 3 refills | Status: DC

## 2017-05-07 MED ORDER — LEVOTHYROXINE SODIUM 112 MCG PO TABS: ORAL_TABLET | ORAL | 3 refills | Status: DC

## 2017-05-07 MED ORDER — LOSARTAN POTASSIUM-HCTZ 100-25 MG PO TABS: 1.0000 | ORAL_TABLET | Freq: Every day | ORAL | 3 refills | Status: DC

## 2017-05-07 MED ORDER — DULOXETINE HCL 60 MG PO CPEP: 60.0000 mg | ORAL_CAPSULE | Freq: Every day | ORAL | 1 refills | Status: DC

## 2017-06-05 ENCOUNTER — Telehealth: Payer: Self-pay | Admitting: Family Medicine

## 2017-06-05 NOTE — Telephone Encounter (Signed)
Patient left vm asking for all her medications called into [facility].  CB# [phone]

## 2017-06-10 NOTE — Telephone Encounter (Signed)
All meds were sent on 05/07/17 for 90 days with refills - called pt LMOVM to call pharmacy about prescriptions and if they do not have will resend.

## 2017-06-24 ENCOUNTER — Encounter: Payer: Self-pay | Admitting: Family Medicine

## 2017-06-24 ENCOUNTER — Ambulatory Visit: Payer: Self-pay | Admitting: Family Medicine

## 2017-06-24 VITALS — BP 100/60 | HR 92 | Temp 98.0°F | Resp 18 | Ht 63.0 in | Wt 203.0 lb

## 2017-06-24 DIAGNOSIS — J209 Acute bronchitis, unspecified: Secondary | ICD-10-CM | POA: Diagnosis not present

## 2017-06-24 MED ORDER — AZITHROMYCIN 250 MG PO TABS: ORAL_TABLET | ORAL | 0 refills | Status: DC

## 2017-06-24 NOTE — Progress Notes (Signed)
Subjective:    Patient ID: [name] [name], female    DOB: 1965-12-16, 52 y.o.   MRN: [id number]  HPI Patient has had a cough for 4 weeks.  She reports chest congestion.  However she cannot cough hard enough to break it up and cough anything up.  She reports shortness of breath and dyspnea on exertion.  She denies any fevers.  She denies any chills however she states that she feels sick and weak and lethargic.  She denies any rhinorrhea.  She denies any sore throat.  She denies any sinus pain.  She denies any chest pain.  She denies any pleurisy or hemoptysis.  She continues to smoke although she has no documented history of COPD or emphysema.  She is wheezing slightly on exam otherwise lungs are clear to auscultation bilaterally.  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 Past Medical History:  Diagnosis Date  . AAA (abdominal aortic aneurysm) (HCC)    3.6 cm 04/2016  . Anxiety   . Cough   . Depression   . Hypertension   . Hypothyroid 11/28/2016  . Hypothyroidism   . Kidney stone   . Nicotine dependence, cigarettes, with other nicotine-induced disorders   . Nocturia   . OSA on CPAP    AHI 49.3, cpap at 10 cm H2O (07/05/16)  . Ovarian cancer ([place])   . Pneumonia   . Sleep apnea   . Thyroid disease   . Tonsillar and adenoid hypertrophy    Past Surgical History:  Procedure Laterality Date  . ABDOMINAL HYSTERECTOMY  2003  . CHOLECYSTECTOMY    . COLONOSCOPY N/A 12/27/2016   Procedure: COLONOSCOPY;  Surgeon: Rogene Houston, MD;  [facility];  Service: Endoscopy;  Laterality: N/A;  2:15  . HEMORRHOID SURGERY  2007  . SEPTOPLASTY N/A 08/24/2014   Procedure: SEPTOPLASTY;  Surgeon: Jodi Marble, MD;  Location: [place];  Service: ENT;  Laterality: N/A;  . TONSILLECTOMY N/A 08/24/2014   Procedure: TONSILLECTOMY;  Surgeon: Jodi Marble, MD;  Location: [place];  Service: ENT;  Laterality: N/A;  . TUBAL LIGATION    . TURBINATE REDUCTION N/A 08/24/2014   Procedure: TURBINATE REDUCTION AND UVULECTOMY;  Surgeon: Jodi Marble, MD;  Location: [place];  Service: ENT;  Laterality: N/A;   Current Outpatient Medications on File Prior to Visit  Medication Sig Dispense Refill  . ALPRAZolam (XANAX) 0.5 MG tablet TAKE ONE TABLET BY MOUTH THREE TIMES DAILY AS NEEDED FOR ANXIETY OR SLEEP (Patient taking differently: Take 0.5 mg by mouth at bedtime as needed for sleep. ) 60 tablet 2  . DULoxetine (CYMBALTA) 60 MG capsule Take 1 capsule (60 mg total) by mouth daily. 90 capsule 1  . ibuprofen (ADVIL,MOTRIN) 200 MG tablet Take 800 mg by mouth every 8 (eight) hours as needed for headache.    . levothyroxine (SYNTHROID, LEVOTHROID) 112 MCG tablet TAKE ONE TABLET BY MOUTH ONCE DAILY BEFORE BREAKFAST 90 tablet 3  . losartan-hydrochlorothiazide (HYZAAR) 100-25 MG tablet Take 1 tablet by mouth daily. 90 tablet 3  . Multiple Vitamin (MULTIVITAMIN WITH MINERALS) TABS tablet Take 1 tablet by mouth daily.    . Omega-3 Fatty Acids (FISH OIL) 1000 MG CAPS Take 2,000 mg by mouth daily.     . ranitidine (ZANTAC) 150 MG tablet Take 1 tablet (150 mg total) by mouth at bedtime. 90 tablet 3  . topiramate (TOPAMAX) 25 MG tablet Take 2 tablets (50 mg total) by mouth 2 (two) times daily.  120 tablet 1   No current facility-administered medications on file prior to visit.    Allergies  Allergen Reactions  . Other Other (See Comments)    IV CONTRAST caused crazy thoughts and speech   Social History   Socioeconomic History  . Marital status: Married    Spouse name: [name]  . Number of children: 1  . Years of education: 14  . Highest education level: Not on file  Occupational History    Employer: [employer]  . Financial resource strain: Not on file  . Food insecurity:    Worry: Not on file    Inability: Not on file  . Transportation needs:    Medical: Not on file    Non-medical: Not on file  Tobacco Use  . Smoking status: Current Every Day Smoker     Packs/day: 1.00    Years: 17.00    Pack years: 17.00    Types: Cigarettes  . Smokeless tobacco: Never Used  Substance and Sexual Activity  . Alcohol use: No  . Drug use: No  . Sexual activity: Not on file  Lifestyle  . Physical activity:    Days per week: Not on file    Minutes per session: Not on file  . Stress: Not on file  Relationships  . Social connections:    Talks on phone: Not on file    Gets together: Not on file    Attends religious service: Not on file    Active member of club or organization: Not on file    Attends meetings of clubs or organizations: Not on file    Relationship status: Not on file  . Intimate partner violence:    Fear of current or ex partner: Not on file    Emotionally abused: Not on file    Physically abused: Not on file    Forced sexual activity: Not on file  Other Topics Concern  . Not on file  Social History Narrative   Patient is married [name]) and lives at home with her husband.   Patient has one adult child.   Patient is working full-time.   Patient has a high school education.   Patient is left-handed.   Patient drinks a 2 liter per day.      Review of Systems  All other systems reviewed and are negative.      Objective:   Physical Exam  Constitutional: She appears well-developed and well-nourished. No distress.  HENT:  Right Ear: External ear normal.  Left Ear: External ear normal.  Nose: Nose normal.  Mouth/Throat: Oropharynx is clear and moist. No oropharyngeal exudate.  Eyes: Right eye exhibits no discharge. Left eye exhibits no discharge.  Cardiovascular: Normal rate, regular rhythm and normal heart sounds.  Pulmonary/Chest: Effort normal. No stridor. No respiratory distress. She has wheezes. She has no rales.  Lymphadenopathy:    She has no cervical adenopathy.  Skin: She is not diaphoretic.  Vitals reviewed.         Assessment & Plan:  Bronchitis, acute, with bronchospasm  I believe the patient has  acute bronchitis bordering on chronic bronchitis.  I have recommended starting a Z-Pak to cover atypical bacterial infections and start do Lara 100/5, 2 puffs inhaled twice daily.  Strongly recommended smoking cessation.  Recheck in 1 week if no better or sooner if worse

## 2017-06-26 ENCOUNTER — Ambulatory Visit: Payer: Commercial Managed Care - PPO | Admitting: Family Medicine

## 2017-07-03 ENCOUNTER — Other Ambulatory Visit: Payer: Self-pay | Admitting: Family Medicine

## 2017-11-02 ENCOUNTER — Other Ambulatory Visit: Payer: Self-pay | Admitting: Family Medicine

## 2017-12-24 ENCOUNTER — Other Ambulatory Visit: Payer: Self-pay | Admitting: Family Medicine

## 2017-12-24 NOTE — Telephone Encounter (Signed)
Pt is requesting refill on Xanax   LOV: 06/24/17  LRF:   05/28/16  #60/2

## 2017-12-25 MED ORDER — ALPRAZOLAM 0.5 MG PO TABS: ORAL_TABLET | ORAL | 2 refills | Status: DC

## 2017-12-30 ENCOUNTER — Other Ambulatory Visit: Payer: Self-pay | Admitting: Family Medicine

## 2018-01-11 ENCOUNTER — Other Ambulatory Visit: Payer: Self-pay | Admitting: Family Medicine

## 2018-01-12 NOTE — Telephone Encounter (Signed)
Pt is requesting refill on Xanax   LOV: 06/24/17  LRF:  12/25/17

## 2018-02-06 ENCOUNTER — Encounter: Payer: Self-pay | Admitting: Family Medicine

## 2018-02-06 ENCOUNTER — Ambulatory Visit (INDEPENDENT_AMBULATORY_CARE_PROVIDER_SITE_OTHER): Payer: 59 | Admitting: Family Medicine

## 2018-02-06 VITALS — BP 138/94 | HR 84 | Temp 98.1°F | Resp 14 | Ht 63.0 in | Wt 196.0 lb

## 2018-02-06 DIAGNOSIS — Z0000 Encounter for general adult medical examination without abnormal findings: Secondary | ICD-10-CM | POA: Diagnosis not present

## 2018-02-06 DIAGNOSIS — Z1239 Encounter for other screening for malignant neoplasm of breast: Secondary | ICD-10-CM

## 2018-02-06 DIAGNOSIS — I10 Essential (primary) hypertension: Secondary | ICD-10-CM | POA: Diagnosis not present

## 2018-02-06 DIAGNOSIS — E039 Hypothyroidism, unspecified: Secondary | ICD-10-CM

## 2018-02-06 DIAGNOSIS — G4733 Obstructive sleep apnea (adult) (pediatric): Secondary | ICD-10-CM | POA: Diagnosis not present

## 2018-02-06 DIAGNOSIS — I714 Abdominal aortic aneurysm, without rupture, unspecified: Secondary | ICD-10-CM

## 2018-02-06 DIAGNOSIS — Z8543 Personal history of malignant neoplasm of ovary: Secondary | ICD-10-CM

## 2018-02-06 NOTE — Progress Notes (Signed)
Subjective:    Patient ID: [name] [name], female    DOB: 1965/06/30, 53 y.o.   MRN: [id number]  HPI Patient is a 53 year old Caucasian female here today for physical exam.  She states that she is here this because of her life insurance.  Past medical history significant for a total abdominal hysterectomy and bilateral salpingo-oophorectomy in 2003 for ovarian cancer.  She has not been seeing an oncologist.  Therefore I would check a Ca1 25.  The last CT scan I see on her chart was in 2004.  Her last mammogram was in 2018.  This is overdue.  She also has a past medical history of obstructive sleep apnea.  However she had her tonsils removed.  She has not had a sleep study since that to see if she still has sleep apnea however she denies hypersomnolence or witnessed sleep apneic episodes.  She was under the assumption that the surgery cured her sleep apnea.  Her husband denies any apnea.  However the patient takes excessive caffeine.  She abuses Goody powders, diet sun drop, and monster energy drinks on a daily basis in order to give her energy so that she can work.  This leads me to believe that she may be dealing with hypersomnia.  I explained to the patient I am concerned about the amount of caffeine as well as the amount of aspirin that she is taken on a daily basis regarding long-term cardiovascular risk and possible GI toxicity.  I recommended against this.  She is still smoking.  She has no desire to quit.  Her immunizations are up-to-date.  She has had her flu shot.  Her last colonoscopy was last year and was significant for polyps however there were no precancerous lesions. Past Medical History:  Diagnosis Date  . AAA (abdominal aortic aneurysm) (HCC)    3.6 cm 04/2016  . Anxiety   . Cough   . Depression   . Hypertension   . Hypothyroid 11/28/2016  . Hypothyroidism   . Kidney stone   . Nicotine dependence, cigarettes, with other nicotine-induced disorders   . Nocturia   . OSA on CPAP    AHI  49.3, cpap at 10 cm H2O (07/05/16)  . Ovarian cancer ([place])   . Pneumonia   . Sleep apnea   . Thyroid disease   . Tonsillar and adenoid hypertrophy    Past Surgical History:  Procedure Laterality Date  . ABDOMINAL HYSTERECTOMY  2003  . CHOLECYSTECTOMY    . COLONOSCOPY N/A 12/27/2016   Procedure: COLONOSCOPY;  Surgeon: Rogene Houston, MD;  [facility];  Service: Endoscopy;  Laterality: N/A;  2:15  . HEMORRHOID SURGERY  2007  . SEPTOPLASTY N/A 08/24/2014   Procedure: SEPTOPLASTY;  Surgeon: Jodi Marble, MD;  Location: [place];  Service: ENT;  Laterality: N/A;  . TONSILLECTOMY N/A 08/24/2014   Procedure: TONSILLECTOMY;  Surgeon: Jodi Marble, MD;  Location: [place];  Service: ENT;  Laterality: N/A;  . TUBAL LIGATION    . TURBINATE REDUCTION N/A 08/24/2014   Procedure: TURBINATE REDUCTION AND UVULECTOMY;  Surgeon: Jodi Marble, MD;  Location: [place];  Service: ENT;  Laterality: N/A;   Current Outpatient Medications on File Prior to Visit  Medication Sig Dispense Refill  . ALPRAZolam (XANAX) 0.5 MG tablet TAKE ONE TABLET BY MOUTH THREE TIMES DAILY AS NEEDED FOR ANXIETY/SLEEP 60 tablet 1  . DULoxetine (CYMBALTA) 60 MG capsule TAKE ONE CAPSULE BY MOUTH DAILY 90 capsule 1  . ibuprofen (  ADVIL,MOTRIN) 200 MG tablet Take 800 mg by mouth every 8 (eight) hours as needed for headache.    . levothyroxine (SYNTHROID, LEVOTHROID) 112 MCG tablet TAKE ONE TABLET BY MOUTH ONCE DAILY BEFORE BREAKFAST 90 tablet 3  . losartan-hydrochlorothiazide (HYZAAR) 100-25 MG tablet Take 1 tablet by mouth daily. 90 tablet 3  . Multiple Vitamin (MULTIVITAMIN WITH MINERALS) TABS tablet Take 1 tablet by mouth daily.    . Omega-3 Fatty Acids (FISH OIL) 1000 MG CAPS Take 2,000 mg by mouth daily.     Marland Kitchen topiramate (TOPAMAX) 25 MG tablet TAKE TWO TABLETS BY MOUTH TWICE DAILY 120 tablet 1   No current facility-administered medications on file prior to visit.    Allergies  Allergen Reactions  . Other Other (See  Comments)    IV CONTRAST caused crazy thoughts and speech   Social History   Socioeconomic History  . Marital status: Married    Spouse name: [name]  . Number of children: 1  . Years of education: 60  . Highest education level: Not on file  Occupational History    Employer: [employer]  . Financial resource strain: Not on file  . Food insecurity:    Worry: Not on file    Inability: Not on file  . Transportation needs:    Medical: Not on file    Non-medical: Not on file  Tobacco Use  . Smoking status: Current Every Day Smoker    Packs/day: 1.00    Years: 17.00    Pack years: 17.00    Types: Cigarettes  . Smokeless tobacco: Never Used  Substance and Sexual Activity  . Alcohol use: No  . Drug use: No  . Sexual activity: Not on file  Lifestyle  . Physical activity:    Days per week: Not on file    Minutes per session: Not on file  . Stress: Not on file  Relationships  . Social connections:    Talks on phone: Not on file    Gets together: Not on file    Attends religious service: Not on file    Active member of club or organization: Not on file    Attends meetings of clubs or organizations: Not on file    Relationship status: Not on file  . Intimate partner violence:    Fear of current or ex partner: Not on file    Emotionally abused: Not on file    Physically abused: Not on file    Forced sexual activity: Not on file  Other Topics Concern  . Not on file  Social History Narrative   Patient is married [name]) and lives at home with her husband.   Patient has one adult child.   Patient is working full-time.   Patient has a high school education.   Patient is left-handed.   Patient drinks a 2 liter per day.   Family History  Problem Relation Age of Onset  . Parkinson's disease Mother   . Heart Problems Father   . Thyroid disease Maternal Grandmother   . Lymphoma Maternal Grandmother   . Colon cancer Neg Hx       Review of Systems  All  other systems reviewed and are negative.      Objective:   Physical Exam Vitals signs reviewed.  Constitutional:      General: She is not in acute distress.    Appearance: Normal appearance. She is obese. She is not ill-appearing, toxic-appearing or diaphoretic.  HENT:  Head: Normocephalic and atraumatic.     Right Ear: Tympanic membrane, ear canal and external ear normal. There is no impacted cerumen.     Left Ear: Tympanic membrane, ear canal and external ear normal. There is no impacted cerumen.     Nose: Nose normal. No congestion or rhinorrhea.     Mouth/Throat:     Mouth: Mucous membranes are moist.     Pharynx: No oropharyngeal exudate or posterior oropharyngeal erythema.  Eyes:     General:        Right eye: No discharge.        Left eye: No discharge.     Extraocular Movements: Extraocular movements intact.     Conjunctiva/sclera: Conjunctivae normal.     Pupils: Pupils are equal, round, and reactive to light.  Neck:     Musculoskeletal: Neck supple.     Vascular: No carotid bruit.  Cardiovascular:     Rate and Rhythm: Normal rate and regular rhythm.     Pulses: Normal pulses.     Heart sounds: Normal heart sounds. No murmur. No friction rub. No gallop.   Pulmonary:     Effort: Pulmonary effort is normal. No respiratory distress.     Breath sounds: Normal breath sounds. No stridor. No wheezing, rhonchi or rales.  Chest:     Chest wall: No tenderness.  Abdominal:     General: Bowel sounds are normal. There is no distension.     Palpations: Abdomen is soft. There is no mass.     Tenderness: There is no abdominal tenderness. There is no guarding or rebound.     Hernia: No hernia is present.  Musculoskeletal:     Right lower leg: No edema.     Left lower leg: No edema.  Lymphadenopathy:     Cervical: No cervical adenopathy.  Skin:    General: Skin is warm.     Coloration: Skin is not jaundiced or pale.     Findings: No bruising, erythema, lesion or rash.    Neurological:     General: No focal deficit present.     Mental Status: She is alert and oriented to person, place, and time.     Cranial Nerves: No cranial nerve deficit.     Sensory: No sensory deficit.     Motor: No weakness.     Coordination: Coordination normal.     Gait: Gait normal.     Deep Tendon Reflexes: Reflexes normal.  Psychiatric:        Attention and Perception: Attention normal.        Mood and Affect: Affect is angry.        Speech: Speech normal.        Behavior: Behavior normal.        Cognition and Memory: Cognition and memory normal.           Assessment & Plan:  General medical exam  Hypothyroidism, unspecified type - Plan: CBC with Differential/Platelet, COMPLETE METABOLIC PANEL WITH GFR, Lipid panel, TSH  Benign essential HTN - Plan: CBC with Differential/Platelet, COMPLETE METABOLIC PANEL WITH GFR, Lipid panel  Obstructive sleep apnea hypopnea, severe - Plan: Ambulatory referral to Sleep Studies  Abdominal aortic aneurysm (AAA) without rupture (HCC)  History of ovarian cancer - Plan: CA 125  Breast cancer screening - Plan: MM Digital Screening  Patient is agitated and seems frustrated to be here today.  She states that she does not like coming to doctors.  I have recommended a mammogram  which I will schedule as this is overdue.  Colonoscopy is up-to-date.  She does not require Pap smear given her history of a hysterectomy however I would check a Ca1 25 level given her history of ovarian cancer and if elevated, would recommend imaging of the abdomen and pelvis.  She has a previous documented history of abdominal aortic aneurysm however in 2018, she had an ultrasound which showed no abdominal aortic aneurysm.  Therefore I do not believe she needs follow-up studies at this.  She has a history of obstructive sleep apnea.  She is not wearing a CPAP machine.  She is under the impression that her tonsils being removed cured her of sleep apnea.  I have  recommended that we recheck this to ensure that she is appropriately treated.  Strongly recommended smoking cessation however the patient is certainly in the pre-contemplative phase at the present time and has no interest in quitting.  Also recommended discontinuation of Goody powders and caffeine as I believe she is at high risk for ulcers as well as cardiovascular toxicity due to the amount of caffeine that she is taking in the amount of aspirin that she is taking.  Check CBC, CMP, fasting lipid panel.  Blood pressure today is borderline however I believe a lot of this is due to the patient's agitation.

## 2018-02-09 LAB — LIPID PANEL
CHOLESTEROL: 206 mg/dL — AB (ref <200)
HDL: 34 mg/dL — AB (ref >50)
LDL Cholesterol (Calc): 136 mg/dL (calc) — ABNORMAL HIGH
Non-HDL Cholesterol (Calc): 172 mg/dL (calc) — ABNORMAL HIGH (ref <130)
Total CHOL/HDL Ratio: 6.1 (calc) — ABNORMAL HIGH (ref <5.0)
Triglycerides: 218 mg/dL — ABNORMAL HIGH (ref <150)

## 2018-02-09 LAB — CBC WITH DIFFERENTIAL/PLATELET
ABSOLUTE MONOCYTES: 279 {cells}/uL (ref 200–950)
BASOS PCT: 1 %
Basophils Absolute: 62 cells/uL (ref 0–200)
EOS ABS: 174 {cells}/uL (ref 15–500)
Eosinophils Relative: 2.8 %
HCT: 39.1 % (ref 35.0–45.0)
HEMOGLOBIN: 13.1 g/dL (ref 11.7–15.5)
Lymphs Abs: 3181 cells/uL (ref 850–3900)
MCH: 30.2 pg (ref 27.0–33.0)
MCHC: 33.5 g/dL (ref 32.0–36.0)
MCV: 90.1 fL (ref 80.0–100.0)
MONOS PCT: 4.5 %
MPV: 9.1 fL (ref 7.5–12.5)
Neutro Abs: 2505 cells/uL (ref 1500–7800)
Neutrophils Relative %: 40.4 %
Platelets: 301 10*3/uL (ref 140–400)
RBC: 4.34 10*6/uL (ref 3.80–5.10)
RDW: 14.5 % (ref 11.0–15.0)
TOTAL LYMPHOCYTE: 51.3 %
WBC: 6.2 10*3/uL (ref 3.8–10.8)

## 2018-02-09 LAB — COMPLETE METABOLIC PANEL WITH GFR
AG Ratio: 1.5 (calc) (ref 1.0–2.5)
ALBUMIN MSPROF: 4.3 g/dL (ref 3.6–5.1)
ALKALINE PHOSPHATASE (APISO): 54 U/L (ref 33–130)
ALT: 13 U/L (ref 6–29)
AST: 15 U/L (ref 10–35)
BILIRUBIN TOTAL: 0.4 mg/dL (ref 0.2–1.2)
BUN / CREAT RATIO: 35 (calc) — AB (ref 6–22)
BUN: 26 mg/dL — AB (ref 7–25)
CHLORIDE: 108 mmol/L (ref 98–110)
CO2: 22 mmol/L (ref 20–32)
Calcium: 9.9 mg/dL (ref 8.6–10.4)
Creat: 0.74 mg/dL (ref 0.50–1.05)
GFR, Est African American: 109 mL/min/{1.73_m2} (ref >=60)
GFR, Est Non African American: 94 mL/min/{1.73_m2} (ref >=60)
GLOBULIN: 2.9 g/dL (ref 1.9–3.7)
GLUCOSE: 89 mg/dL (ref 65–99)
Potassium: 4.1 mmol/L (ref 3.5–5.3)
SODIUM: 140 mmol/L (ref 135–146)
Total Protein: 7.2 g/dL (ref 6.1–8.1)

## 2018-02-09 LAB — TSH: TSH: 6.33 m[IU]/L — AB

## 2018-02-09 LAB — CA 125: CA 125: 10 U/mL (ref <35)

## 2018-02-13 ENCOUNTER — Other Ambulatory Visit: Payer: Self-pay | Admitting: Family Medicine

## 2018-02-13 DIAGNOSIS — E039 Hypothyroidism, unspecified: Secondary | ICD-10-CM

## 2018-02-13 DIAGNOSIS — I10 Essential (primary) hypertension: Secondary | ICD-10-CM

## 2018-02-13 DIAGNOSIS — Z79899 Other long term (current) drug therapy: Secondary | ICD-10-CM

## 2018-02-13 DIAGNOSIS — E7800 Pure hypercholesterolemia, unspecified: Secondary | ICD-10-CM

## 2018-02-13 MED ORDER — LEVOTHYROXINE SODIUM 125 MCG PO TABS: 125.0000 ug | ORAL_TABLET | Freq: Every day | ORAL | 0 refills | Status: DC

## 2018-02-13 MED ORDER — ATORVASTATIN CALCIUM 20 MG PO TABS: 20.0000 mg | ORAL_TABLET | Freq: Every day | ORAL | 1 refills | Status: DC

## 2018-03-03 LAB — HM MAMMOGRAPHY: HM MAMMO: ABNORMAL — AB (ref 0–4)

## 2018-03-11 ENCOUNTER — Encounter: Payer: Self-pay | Admitting: *Deleted

## 2018-03-16 ENCOUNTER — Other Ambulatory Visit: Payer: Self-pay | Admitting: Family Medicine

## 2018-03-17 LAB — HM MAMMOGRAPHY

## 2018-03-19 ENCOUNTER — Encounter: Payer: Self-pay | Admitting: *Deleted

## 2018-04-15 ENCOUNTER — Other Ambulatory Visit: Payer: Self-pay | Admitting: Family Medicine

## 2018-05-10 ENCOUNTER — Other Ambulatory Visit: Payer: Self-pay | Admitting: Family Medicine

## 2018-05-11 NOTE — Telephone Encounter (Signed)
Pt is requesting refill on Xanax   LOV: 02/06/18  LRF:   01/13/18

## 2018-05-20 ENCOUNTER — Other Ambulatory Visit: Payer: Self-pay | Admitting: Family Medicine

## 2018-07-19 ENCOUNTER — Other Ambulatory Visit: Payer: Self-pay | Admitting: Family Medicine

## 2018-07-20 NOTE — Telephone Encounter (Signed)
Ok to refill??  Last office visit 02/06/2018.  Last refill 05/11/2018, #1 refill.

## 2018-09-28 ENCOUNTER — Other Ambulatory Visit: Payer: Self-pay | Admitting: Family Medicine

## 2018-11-05 ENCOUNTER — Other Ambulatory Visit: Payer: Self-pay | Admitting: Family Medicine

## 2018-11-25 ENCOUNTER — Other Ambulatory Visit: Payer: Self-pay | Admitting: Family Medicine

## 2019-01-21 ENCOUNTER — Other Ambulatory Visit: Payer: Self-pay | Admitting: Family Medicine

## 2019-01-21 NOTE — Telephone Encounter (Signed)
Ok to refill??  Last office visit 02/06/2018.  Last refill 07/20/2018, #1 refill.

## 2019-02-01 ENCOUNTER — Other Ambulatory Visit: Payer: Self-pay | Admitting: Family Medicine

## 2019-03-16 ENCOUNTER — Encounter: Payer: Self-pay | Admitting: Family Medicine

## 2019-03-16 ENCOUNTER — Ambulatory Visit (INDEPENDENT_AMBULATORY_CARE_PROVIDER_SITE_OTHER): Payer: No Typology Code available for payment source | Admitting: Family Medicine

## 2019-03-16 ENCOUNTER — Other Ambulatory Visit: Payer: Self-pay

## 2019-03-16 VITALS — BP 126/82 | HR 86 | Temp 97.6°F | Resp 18 | Ht 63.0 in | Wt 201.0 lb

## 2019-03-16 DIAGNOSIS — I10 Essential (primary) hypertension: Secondary | ICD-10-CM

## 2019-03-16 DIAGNOSIS — Z1231 Encounter for screening mammogram for malignant neoplasm of breast: Secondary | ICD-10-CM

## 2019-03-16 DIAGNOSIS — Z0001 Encounter for general adult medical examination with abnormal findings: Secondary | ICD-10-CM | POA: Diagnosis not present

## 2019-03-16 DIAGNOSIS — Z0000 Encounter for general adult medical examination without abnormal findings: Secondary | ICD-10-CM

## 2019-03-16 DIAGNOSIS — M722 Plantar fascial fibromatosis: Secondary | ICD-10-CM

## 2019-03-16 DIAGNOSIS — Z8543 Personal history of malignant neoplasm of ovary: Secondary | ICD-10-CM

## 2019-03-16 DIAGNOSIS — E039 Hypothyroidism, unspecified: Secondary | ICD-10-CM

## 2019-03-16 MED ORDER — ALPRAZOLAM 0.5 MG PO TABS: 0.5000 mg | ORAL_TABLET | Freq: Three times a day (TID) | ORAL | 1 refills | Status: DC | PRN

## 2019-03-16 NOTE — Progress Notes (Signed)
Subjective:    Patient ID: [name] [name], female    DOB: Jan 06, 1966, 54 y.o.   MRN: [id number]  HPI Patient is a 54 year old Caucasian female here today for complete physical exam.  Her last colonoscopy was in 2018 and was clear except for a few polyps.  She is due for repeat colonoscopy in 2023.  She is due for mammogram and she asked me to schedule this for her.  She has a history of a total abdominal hysterectomy.  Therefore she does not require Pap smear.  She has not yet due for a bone density test.  Of note, the patient does have a questionable history of a AAA.  However I performed an abdominal aneurysm ultrasound in 2018 which revealed no evidence of a AAA.  Therefore we are not repeating that test.  She also has a history of tobacco abuse, hypertension, hypothyroidism.  She continues to smoke.  She also overuses Goody powders.  Her husband states that she uses 46 a week!.  She has chronic daily headache despite taking Topamax.  I am concerned about her overuse of Goody powders and I recommended that she stop.  I would recommend trying Aimovig for headache suppression or even a consultation with a neurologist however the patient politely declines that today.  She is due for pneumonia shot however she also complains of pain in her right heel near the insertion of the plantar fascia.  She would like to receive a cortisone injection in that area.  I recommended not having a cortisone injection and a pneumonia shot on the same day.  She can return anytime next week for the pneumonia shot however she would prefer to get a cortisone injection today.  She states the pain feels like someone is stabbing in her foot in that area every time she gets out of bed in the morning.  It also hurts when she is on her feet for prolonged periods of time throughout the day. Past Medical History:  Diagnosis Date  . AAA (abdominal aortic aneurysm) (HCC)    3.6 cm 04/2016  . Anxiety   . Cough   . Depression   .  Hypertension   . Hypothyroid 11/28/2016  . Hypothyroidism   . Kidney stone   . Nicotine dependence, cigarettes, with other nicotine-induced disorders   . Nocturia   . OSA on CPAP    AHI 49.3, cpap at 10 cm H2O (07/05/16)  . Ovarian cancer ([place])   . Pneumonia   . Sleep apnea   . Thyroid disease   . Tonsillar and adenoid hypertrophy    Past Surgical History:  Procedure Laterality Date  . ABDOMINAL HYSTERECTOMY  2003  . CHOLECYSTECTOMY    . COLONOSCOPY N/A 12/27/2016   Procedure: COLONOSCOPY;  Surgeon: Rogene Houston, MD;  [facility];  Service: Endoscopy;  Laterality: N/A;  2:15  . HEMORRHOID SURGERY  2007  . SEPTOPLASTY N/A 08/24/2014   Procedure: SEPTOPLASTY;  Surgeon: Jodi Marble, MD;  Location: [place];  Service: ENT;  Laterality: N/A;  . TONSILLECTOMY N/A 08/24/2014   Procedure: TONSILLECTOMY;  Surgeon: Jodi Marble, MD;  Location: [place];  Service: ENT;  Laterality: N/A;  . TUBAL LIGATION    . TURBINATE REDUCTION N/A 08/24/2014   Procedure: TURBINATE REDUCTION AND UVULECTOMY;  Surgeon: Jodi Marble, MD;  Location: [place];  Service: ENT;  Laterality: N/A;   Current Outpatient Medications on File Prior to Visit  Medication Sig Dispense Refill  . ALPRAZolam Duanne Moron)  0.5 MG tablet TAKE 1 TABLET BY MOUTH THREE TIMES DAILY AS NEEDED FOR ANXIETY OR SLEEP 60 tablet 1  . atorvastatin (LIPITOR) 20 MG tablet TAKE 1 TABLET BY MOUTH EVERY DAY 90 tablet 1  . DULoxetine (CYMBALTA) 60 MG capsule TAKE ONE CAPSULE BY MOUTH DAILY 90 capsule 0  . hydrochlorothiazide (HYDRODIURIL) 25 MG tablet TAKE 1 TABLET BY MOUTH EVERY DAY 90 tablet 1  . ibuprofen (ADVIL,MOTRIN) 200 MG tablet Take 800 mg by mouth every 8 (eight) hours as needed for headache.    . levothyroxine (SYNTHROID) 125 MCG tablet TAKE 1 TABLET BY MOUTH DAILY 90 tablet 1  . losartan (COZAAR) 100 MG tablet TAKE 1 TABLET BY MOUTH EVERY DAY 90 tablet 1  . losartan-hydrochlorothiazide (HYZAAR) 100-25 MG tablet Take 1 tablet by  mouth daily. 90 tablet 3  . Multiple Vitamin (MULTIVITAMIN WITH MINERALS) TABS tablet Take 1 tablet by mouth daily.    . Omega-3 Fatty Acids (FISH OIL) 1000 MG CAPS Take 2,000 mg by mouth daily.     Marland Kitchen topiramate (TOPAMAX) 25 MG tablet TAKE TWO TABLETS BY MOUTH TWICE DAILY 120 tablet 1   No current facility-administered medications on file prior to visit.   Allergies  Allergen Reactions  . Other Other (See Comments)    IV CONTRAST caused crazy thoughts and speech   Social History   Socioeconomic History  . Marital status: Married    Spouse name: [name]  . Number of children: 1  . Years of education: 25  . Highest education level: Not on file  Occupational History    Employer: [employer]  Tobacco Use  . Smoking status: Current Every Day Smoker    Packs/day: 1.00    Years: 17.00    Pack years: 17.00    Types: Cigarettes  . Smokeless tobacco: Never Used  Substance and Sexual Activity  . Alcohol use: No  . Drug use: No  . Sexual activity: Not on file  Other Topics Concern  . Not on file  Social History Narrative   Patient is married [name]) and lives at home with her husband.   Patient has one adult child.   Patient is working full-time.   Patient has a high school education.   Patient is left-handed.   Patient drinks a 2 liter per day.   Social Determinants of Health   Financial Resource Strain:   . Difficulty of Paying Living Expenses: Not on file  Food Insecurity:   . Worried About Charity fundraiser in the Last Year: Not on file  . Ran Out of Food in the Last Year: Not on file  Transportation Needs:   . Lack of Transportation (Medical): Not on file  . Lack of Transportation (Non-Medical): Not on file  Physical Activity:   . Days of Exercise per Week: Not on file  . Minutes of Exercise per Session: Not on file  Stress:   . Feeling of Stress : Not on file  Social Connections:   . Frequency of Communication with Friends and Family: Not on file  .  Frequency of Social Gatherings with Friends and Family: Not on file  . Attends Religious Services: Not on file  . Active Member of Clubs or Organizations: Not on file  . Attends Archivist Meetings: Not on file  . Marital Status: Not on file  Intimate Partner Violence:   . Fear of Current or Ex-Partner: Not on file  . Emotionally Abused: Not on file  . Physically  Abused: Not on file  . Sexually Abused: Not on file   Family History  Problem Relation Age of Onset  . Parkinson's disease Mother   . Heart Problems Father   . Thyroid disease Maternal Grandmother   . Lymphoma Maternal Grandmother   . Colon cancer Neg Hx       Review of Systems  All other systems reviewed and are negative.      Objective:   Physical Exam Vitals reviewed.  Constitutional:      General: She is not in acute distress.    Appearance: Normal appearance. She is obese. She is not ill-appearing, toxic-appearing or diaphoretic.  HENT:     Head: Normocephalic and atraumatic.     Right Ear: Tympanic membrane, ear canal and external ear normal. There is no impacted cerumen.     Left Ear: Tympanic membrane, ear canal and external ear normal. There is no impacted cerumen.     Nose: Nose normal. No congestion or rhinorrhea.     Mouth/Throat:     Mouth: Mucous membranes are moist.     Pharynx: No oropharyngeal exudate or posterior oropharyngeal erythema.  Eyes:     General:        Right eye: No discharge.        Left eye: No discharge.     Extraocular Movements: Extraocular movements intact.     Conjunctiva/sclera: Conjunctivae normal.     Pupils: Pupils are equal, round, and reactive to light.  Neck:     Vascular: No carotid bruit.  Cardiovascular:     Rate and Rhythm: Normal rate and regular rhythm.     Pulses: Normal pulses.     Heart sounds: Normal heart sounds. No murmur. No friction rub. No gallop.   Pulmonary:     Effort: Pulmonary effort is normal. No respiratory distress.     Breath  sounds: Normal breath sounds. No stridor. No wheezing, rhonchi or rales.  Chest:     Chest wall: No tenderness.  Abdominal:     General: Bowel sounds are normal. There is no distension.     Palpations: Abdomen is soft. There is no mass.     Tenderness: There is no abdominal tenderness. There is no guarding or rebound.     Hernia: No hernia is present.  Musculoskeletal:     Cervical back: Neck supple.     Right lower leg: No edema.     Left lower leg: No edema.  Lymphadenopathy:     Cervical: No cervical adenopathy.  Skin:    General: Skin is warm.     Coloration: Skin is not jaundiced or pale.     Findings: No bruising, erythema, lesion or rash.  Neurological:     General: No focal deficit present.     Mental Status: She is alert and oriented to person, place, and time.     Cranial Nerves: No cranial nerve deficit.     Sensory: No sensory deficit.     Motor: No weakness.     Coordination: Coordination normal.     Gait: Gait normal.     Deep Tendon Reflexes: Reflexes normal.  Psychiatric:        Attention and Perception: Attention normal.        Speech: Speech normal.        Behavior: Behavior normal.        Cognition and Memory: Cognition and memory normal.           Assessment & Plan:  General medical exam  Hypothyroidism, unspecified type - Plan: TSH  Benign essential HTN - Plan: CBC with Differential/Platelet, COMPLETE METABOLIC PANEL WITH GFR, Lipid panel  History of ovarian cancer  Encounter for screening mammogram for malignant neoplasm of breast  Abdominal aortic aneurysm (AAA) without rupture ([place])  Ultrasound of the abdomen in 2018 revealed no AAA.  Therefore I will not repeat imaging at this.  Blood pressure today is well controlled at 126/82.  Patient's colonoscopy is up-to-date and she does not require Pap smear.  She is already received her Covid vaccination.  Recommended smoking cessation.  Check TSH, CBC, CMP, fasting lipid panel.  Schedule the  patient for mammogram.  Recommended cessation of Goody powders.  Offered neurology consultation but she declined.  I believe she has plantar fasciitis in her right heel.  She is tender to palpation over the insertion of the plantar fascia on the plantar surface of the right calcaneus.  Using sterile technique, a mixture of 1 cc of lidocaine and 1 cc of 40 mg/mL Kenalog was injected at the point of maximum tenderness in her right heel.  The patient tolerated procedure well without complication.

## 2019-03-17 LAB — CBC WITH DIFFERENTIAL/PLATELET
Absolute Monocytes: 432 cells/uL (ref 200–950)
Basophils Absolute: 72 cells/uL (ref 0–200)
Basophils Relative: 0.9 %
Eosinophils Absolute: 192 cells/uL (ref 15–500)
Eosinophils Relative: 2.4 %
HCT: 40 % (ref 35.0–45.0)
Hemoglobin: 13.3 g/dL (ref 11.7–15.5)
Lymphs Abs: 2912 cells/uL (ref 850–3900)
MCH: 30.7 pg (ref 27.0–33.0)
MCHC: 33.3 g/dL (ref 32.0–36.0)
MCV: 92.4 fL (ref 80.0–100.0)
MPV: 9.1 fL (ref 7.5–12.5)
Monocytes Relative: 5.4 %
Neutro Abs: 4392 cells/uL (ref 1500–7800)
Neutrophils Relative %: 54.9 %
Platelets: 283 10*3/uL (ref 140–400)
RBC: 4.33 10*6/uL (ref 3.80–5.10)
RDW: 14.4 % (ref 11.0–15.0)
Total Lymphocyte: 36.4 %
WBC: 8 10*3/uL (ref 3.8–10.8)

## 2019-03-17 LAB — COMPLETE METABOLIC PANEL WITH GFR
AG Ratio: 1.5 (calc) (ref 1.0–2.5)
ALT: 20 U/L (ref 6–29)
AST: 18 U/L (ref 10–35)
Albumin: 4.2 g/dL (ref 3.6–5.1)
Alkaline phosphatase (APISO): 57 U/L (ref 37–153)
BUN: 25 mg/dL (ref 7–25)
CO2: 25 mmol/L (ref 20–32)
Calcium: 9.9 mg/dL (ref 8.6–10.4)
Chloride: 105 mmol/L (ref 98–110)
Creat: 0.73 mg/dL (ref 0.50–1.05)
GFR, Est African American: 110 mL/min/{1.73_m2} (ref >=60)
GFR, Est Non African American: 95 mL/min/{1.73_m2} (ref >=60)
Globulin: 2.8 g/dL (calc) (ref 1.9–3.7)
Glucose, Bld: 93 mg/dL (ref 65–99)
Potassium: 3.5 mmol/L (ref 3.5–5.3)
Sodium: 139 mmol/L (ref 135–146)
Total Bilirubin: 0.5 mg/dL (ref 0.2–1.2)
Total Protein: 7 g/dL (ref 6.1–8.1)

## 2019-03-17 LAB — LIPID PANEL
Cholesterol: 141 mg/dL (ref <200)
HDL: 28 mg/dL — ABNORMAL LOW (ref >=50)
LDL Cholesterol (Calc): 77 mg/dL (calc)
Non-HDL Cholesterol (Calc): 113 mg/dL (calc) (ref <130)
Total CHOL/HDL Ratio: 5 (calc) — ABNORMAL HIGH (ref <5.0)
Triglycerides: 272 mg/dL — ABNORMAL HIGH (ref <150)

## 2019-03-17 LAB — TSH: TSH: 5.19 mIU/L — ABNORMAL HIGH

## 2019-03-22 ENCOUNTER — Other Ambulatory Visit: Payer: Self-pay | Admitting: *Deleted

## 2019-03-22 DIAGNOSIS — I10 Essential (primary) hypertension: Secondary | ICD-10-CM

## 2019-03-22 DIAGNOSIS — E039 Hypothyroidism, unspecified: Secondary | ICD-10-CM

## 2019-03-22 MED ORDER — LEVOTHYROXINE SODIUM 137 MCG PO TABS: 137.0000 ug | ORAL_TABLET | Freq: Every day | ORAL | 1 refills | Status: DC

## 2019-03-22 MED ORDER — FISH OIL 1000 MG PO CAPS: 2000.0000 mg | ORAL_CAPSULE | Freq: Two times a day (BID) | ORAL | Status: AC

## 2019-03-25 ENCOUNTER — Other Ambulatory Visit: Payer: Self-pay

## 2019-03-25 ENCOUNTER — Ambulatory Visit (INDEPENDENT_AMBULATORY_CARE_PROVIDER_SITE_OTHER): Payer: No Typology Code available for payment source

## 2019-03-25 DIAGNOSIS — Z23 Encounter for immunization: Secondary | ICD-10-CM

## 2019-04-12 ENCOUNTER — Other Ambulatory Visit: Payer: Self-pay | Admitting: Family Medicine

## 2019-05-03 ENCOUNTER — Other Ambulatory Visit: Payer: Self-pay | Admitting: Family Medicine

## 2019-05-11 ENCOUNTER — Other Ambulatory Visit: Payer: Self-pay | Admitting: Family Medicine

## 2019-06-01 ENCOUNTER — Other Ambulatory Visit: Payer: Self-pay | Admitting: Family Medicine

## 2019-06-28 ENCOUNTER — Other Ambulatory Visit: Payer: Self-pay | Admitting: Family Medicine

## 2019-07-29 ENCOUNTER — Other Ambulatory Visit: Payer: Self-pay | Admitting: Family Medicine

## 2019-08-30 ENCOUNTER — Other Ambulatory Visit: Payer: Self-pay | Admitting: Family Medicine

## 2019-10-28 ENCOUNTER — Telehealth: Payer: Self-pay | Admitting: Family Medicine

## 2019-10-28 ENCOUNTER — Other Ambulatory Visit: Payer: Self-pay

## 2019-10-28 MED ORDER — DULOXETINE HCL 60 MG PO CPEP: 60.0000 mg | ORAL_CAPSULE | Freq: Every day | ORAL | 0 refills | Status: DC

## 2019-10-28 NOTE — Telephone Encounter (Signed)
Received fax requesting a refill on DULoxetine (CYMBALTA) 60 MG capsule SIG: TAKE ONE CAPSULE BY MOUTH DAILY QTY: 90  [facility]

## 2019-10-29 ENCOUNTER — Other Ambulatory Visit: Payer: Self-pay | Admitting: Family Medicine

## 2019-11-20 ENCOUNTER — Other Ambulatory Visit: Payer: Self-pay | Admitting: Family Medicine

## 2019-11-20 MED ORDER — ALPRAZOLAM 0.5 MG PO TABS: 0.5000 mg | ORAL_TABLET | Freq: Three times a day (TID) | ORAL | 1 refills | Status: DC | PRN

## 2019-11-28 ENCOUNTER — Other Ambulatory Visit: Payer: Self-pay | Admitting: Family Medicine

## 2019-11-29 NOTE — Telephone Encounter (Signed)
Please Advise

## 2019-12-27 ENCOUNTER — Other Ambulatory Visit: Payer: Self-pay | Admitting: Family Medicine

## 2020-01-20 ENCOUNTER — Other Ambulatory Visit: Payer: Self-pay

## 2020-01-20 ENCOUNTER — Ambulatory Visit (INDEPENDENT_AMBULATORY_CARE_PROVIDER_SITE_OTHER): Payer: No Typology Code available for payment source | Admitting: Family Medicine

## 2020-01-20 ENCOUNTER — Encounter: Payer: Self-pay | Admitting: Family Medicine

## 2020-01-20 VITALS — BP 134/82 | HR 88 | Temp 97.8°F | Resp 14 | Ht 63.0 in | Wt 189.0 lb

## 2020-01-20 DIAGNOSIS — F4329 Adjustment disorder with other symptoms: Secondary | ICD-10-CM

## 2020-01-20 DIAGNOSIS — I10 Essential (primary) hypertension: Secondary | ICD-10-CM | POA: Diagnosis not present

## 2020-01-20 DIAGNOSIS — E039 Hypothyroidism, unspecified: Secondary | ICD-10-CM | POA: Diagnosis not present

## 2020-01-20 DIAGNOSIS — F4381 Prolonged grief disorder: Secondary | ICD-10-CM

## 2020-01-20 MED ORDER — DULOXETINE HCL 60 MG PO CPEP: ORAL_CAPSULE | ORAL | 3 refills | Status: DC

## 2020-01-20 MED ORDER — BUPROPION HCL ER (XL) 150 MG PO TB24: 150.0000 mg | ORAL_TABLET | Freq: Every day | ORAL | 3 refills | Status: DC

## 2020-01-20 NOTE — Progress Notes (Signed)
Subjective:    Patient ID: [name] [name], female    DOB: [birth year], 55 y.o.   MRN: [id number]  HPI Patient is here today for a follow-up.  Since I last saw her, her husband passed away suddenly and unexpectedly likely from a myocardial infarction.  The patient was with her husband when he died.  As a result she has been battling depression.  She states that she sleeps fine.  However she does not want to get out of bed in the morning.  She reports quick temper.  She reports mood swings.  She reports sadness and lack of energy.  She asked if we can increase the Cymbalta.  She is already on 60 mg a day.  She has not met with a grief counselor.  I question her if she feels like she would benefit from speaking with a therapist and she states that she does not feel that this would be beneficial but she would be interested in adding additional medication to battle depression.  Her blood pressure today is well controlled.  She is overdue to recheck her lab work particularly a CMP and a TSH regarding her blood pressure and her hypothyroidism. Past Medical History:  Diagnosis Date  . AAA (abdominal aortic aneurysm) (HCC)    3.6 cm 04/2016  . Anxiety   . Cough   . Depression   . Hypertension   . Hypothyroid 11/28/2016  . Hypothyroidism   . Kidney stone   . Nicotine dependence, cigarettes, with other nicotine-induced disorders   . Nocturia   . OSA on CPAP    AHI 49.3, cpap at 10 cm H2O (07/05/16)  . Ovarian cancer ([place])   . Pneumonia   . Sleep apnea   . Thyroid disease   . Tonsillar and adenoid hypertrophy    Past Surgical History:  Procedure Laterality Date  . ABDOMINAL HYSTERECTOMY  2003  . CHOLECYSTECTOMY    . COLONOSCOPY N/A 12/27/2016   Procedure: COLONOSCOPY;  Surgeon: Rogene Houston, MD;  [facility];  Service: Endoscopy;  Laterality: N/A;  2:15  . HEMORRHOID SURGERY  2007  . SEPTOPLASTY N/A 08/24/2014   Procedure: SEPTOPLASTY;  Surgeon: Jodi Marble, MD;  Location: [facility];   Service: ENT;  Laterality: N/A;  . TONSILLECTOMY N/A 08/24/2014   Procedure: TONSILLECTOMY;  Surgeon: Jodi Marble, MD;  Location: [place];  Service: ENT;  Laterality: N/A;  . TUBAL LIGATION    . TURBINATE REDUCTION N/A 08/24/2014   Procedure: TURBINATE REDUCTION AND UVULECTOMY;  Surgeon: Jodi Marble, MD;  Location: [facility];  Service: ENT;  Laterality: N/A;   Current Outpatient Medications on File Prior to Visit  Medication Sig Dispense Refill  . ALPRAZolam (XANAX) 0.5 MG tablet Take 1 tablet (0.5 mg total) by mouth 3 (three) times daily as needed for anxiety. 60 tablet 1  . atorvastatin (LIPITOR) 20 MG tablet TAKE 1 TABLET BY MOUTH EVERY DAY 90 tablet 1  . hydrochlorothiazide (HYDRODIURIL) 25 MG tablet TAKE 1 TABLET BY MOUTH EVERY DAY 90 tablet 1  . ibuprofen (ADVIL,MOTRIN) 200 MG tablet Take 800 mg by mouth every 8 (eight) hours as needed for headache.    . levothyroxine (SYNTHROID) 125 MCG tablet TAKE 1 TABLET BY MOUTH DAILY 90 tablet 3  . losartan (COZAAR) 100 MG tablet TAKE 1 TABLET BY MOUTH EVERY DAY 90 tablet 1  . Multiple Vitamin (MULTIVITAMIN WITH MINERALS) TABS tablet Take 1 tablet by mouth daily.    . Omega-3 Fatty Acids (FISH  OIL) 1000 MG CAPS Take 2 capsules (2,000 mg total) by mouth in the morning and at bedtime.    . topiramate (TOPAMAX) 25 MG tablet TAKE TWO TABLETS BY MOUTH TWICE DAILY 120 tablet 1   No current facility-administered medications on file prior to visit.   Allergies  Allergen Reactions  . Other Other (See Comments)    IV CONTRAST caused crazy thoughts and speech   Social History   Socioeconomic History  . Marital status: Married    Spouse name: [name]  . Number of children: 1  . Years of education: 66  . Highest education level: Not on file  Occupational History    Employer: [employer]  Tobacco Use  . Smoking status: Current Every Day Smoker    Packs/day: 1.00    Years: 17.00    Pack years: 17.00    Types: Cigarettes  . Smokeless tobacco:  Never Used  Vaping Use  . Vaping Use: Never used  Substance and Sexual Activity  . Alcohol use: No  . Drug use: No  . Sexual activity: Not on file  Other Topics Concern  . Not on file  Social History Narrative   Patient is married [name]) and lives at home with her husband.   Patient has one adult child.   Patient is working full-time.   Patient has a high school education.   Patient is left-handed.   Patient drinks a 2 liter per day.   Social Determinants of Health   Financial Resource Strain: Not on file  Food Insecurity: Not on file  Transportation Needs: Not on file  Physical Activity: Not on file  Stress: Not on file  Social Connections: Not on file  Intimate Partner Violence: Not on file   Family History  Problem Relation Age of Onset  . Parkinson's disease Mother   . Heart Problems Father   . Thyroid disease Maternal Grandmother   . Lymphoma Maternal Grandmother   . Colon cancer Neg Hx       Review of Systems  All other systems reviewed and are negative.      Objective:   Physical Exam Vitals reviewed.  Constitutional:      General: She is not in acute distress.    Appearance: Normal appearance. She is obese. She is not ill-appearing, toxic-appearing or diaphoretic.  HENT:     Head: Normocephalic and atraumatic.     Right Ear: Tympanic membrane, ear canal and external ear normal. There is no impacted cerumen.     Left Ear: Tympanic membrane, ear canal and external ear normal. There is no impacted cerumen.     Nose: Nose normal. No congestion or rhinorrhea.     Mouth/Throat:     Mouth: Mucous membranes are moist.     Pharynx: No oropharyngeal exudate or posterior oropharyngeal erythema.  Eyes:     General:        Right eye: No discharge.        Left eye: No discharge.     Extraocular Movements: Extraocular movements intact.     Conjunctiva/sclera: Conjunctivae normal.     Pupils: Pupils are equal, round, and reactive to light.  Neck:      Vascular: No carotid bruit.  Cardiovascular:     Rate and Rhythm: Normal rate and regular rhythm.     Pulses: Normal pulses.     Heart sounds: Normal heart sounds. No murmur heard. No friction rub. No gallop.   Pulmonary:     Effort: Pulmonary  effort is normal. No respiratory distress.     Breath sounds: Normal breath sounds. No stridor. No wheezing, rhonchi or rales.  Chest:     Chest wall: No tenderness.  Abdominal:     General: Bowel sounds are normal. There is no distension.     Palpations: Abdomen is soft. There is no mass.     Tenderness: There is no abdominal tenderness. There is no guarding or rebound.     Hernia: No hernia is present.  Musculoskeletal:     Cervical back: Neck supple.     Right lower leg: No edema.     Left lower leg: No edema.  Lymphadenopathy:     Cervical: No cervical adenopathy.  Skin:    General: Skin is warm.     Coloration: Skin is not jaundiced or pale.     Findings: No bruising, erythema, lesion or rash.  Neurological:     General: No focal deficit present.     Mental Status: She is alert and oriented to person, place, and time.     Cranial Nerves: No cranial nerve deficit.     Sensory: No sensory deficit.     Motor: No weakness.     Coordination: Coordination normal.     Gait: Gait normal.     Deep Tendon Reflexes: Reflexes normal.  Psychiatric:        Attention and Perception: Attention normal.        Speech: Speech normal.        Behavior: Behavior normal.        Cognition and Memory: Cognition and memory normal.           Assessment & Plan:  Hypothyroidism, unspecified type - Plan: TSH  Benign essential HTN - Plan: COMPLETE METABOLIC PANEL WITH GFR  Grief reaction with prolonged bereavement  Regarding her hypothyroidism, I will check a TSH.  Her blood pressure today is well controlled but I would like to check a CMP to monitor her renal function due to the fact she is on a diuretic.  However we will add Wellbutrin extended  release 150 mg daily to her Cymbalta 60 mg daily as an adjunctive therapy to try to help battle her depression which I believe is only been exacerbated by her grieving the loss of her husband.  I offered her my deepest condolences.  Reassess in 3 to 4 weeks

## 2020-01-21 ENCOUNTER — Encounter: Payer: Self-pay | Admitting: *Deleted

## 2020-01-21 LAB — COMPLETE METABOLIC PANEL WITH GFR
AG Ratio: 1.5 (calc) (ref 1.0–2.5)
ALT: 13 U/L (ref 6–29)
AST: 16 U/L (ref 10–35)
Albumin: 4.2 g/dL (ref 3.6–5.1)
Alkaline phosphatase (APISO): 53 U/L (ref 37–153)
BUN: 25 mg/dL (ref 7–25)
CO2: 24 mmol/L (ref 20–32)
Calcium: 9.9 mg/dL (ref 8.6–10.4)
Chloride: 108 mmol/L (ref 98–110)
Creat: 0.83 mg/dL (ref 0.50–1.05)
GFR, Est African American: 94 mL/min/{1.73_m2} (ref >=60)
GFR, Est Non African American: 81 mL/min/{1.73_m2} (ref >=60)
Globulin: 2.8 g/dL (calc) (ref 1.9–3.7)
Glucose, Bld: 90 mg/dL (ref 65–99)
Potassium: 3.5 mmol/L (ref 3.5–5.3)
Sodium: 141 mmol/L (ref 135–146)
Total Bilirubin: 0.3 mg/dL (ref 0.2–1.2)
Total Protein: 7 g/dL (ref 6.1–8.1)

## 2020-01-21 LAB — TSH: TSH: 1.73 mIU/L

## 2020-01-27 ENCOUNTER — Other Ambulatory Visit: Payer: Self-pay | Admitting: *Deleted

## 2020-01-27 MED ORDER — ATORVASTATIN CALCIUM 20 MG PO TABS: 20.0000 mg | ORAL_TABLET | Freq: Every day | ORAL | 1 refills | Status: DC

## 2020-04-25 ENCOUNTER — Other Ambulatory Visit: Payer: Self-pay | Admitting: Family Medicine

## 2020-04-27 ENCOUNTER — Other Ambulatory Visit: Payer: Self-pay | Admitting: Family Medicine

## 2020-07-25 ENCOUNTER — Other Ambulatory Visit: Payer: Self-pay | Admitting: Family Medicine

## 2020-08-23 ENCOUNTER — Other Ambulatory Visit: Payer: Self-pay | Admitting: Family Medicine

## 2020-10-22 ENCOUNTER — Other Ambulatory Visit: Payer: Self-pay | Admitting: Family Medicine

## 2020-11-03 ENCOUNTER — Other Ambulatory Visit: Payer: Self-pay

## 2020-11-03 ENCOUNTER — Encounter: Payer: Self-pay | Admitting: Family Medicine

## 2020-11-03 ENCOUNTER — Ambulatory Visit: Payer: No Typology Code available for payment source | Admitting: Family Medicine

## 2020-11-03 VITALS — BP 132/76 | HR 90 | Temp 98.1°F | Resp 16 | Ht 63.0 in | Wt 190.0 lb

## 2020-11-03 DIAGNOSIS — E039 Hypothyroidism, unspecified: Secondary | ICD-10-CM | POA: Diagnosis not present

## 2020-11-03 DIAGNOSIS — G2581 Restless legs syndrome: Secondary | ICD-10-CM

## 2020-11-03 DIAGNOSIS — M79604 Pain in right leg: Secondary | ICD-10-CM

## 2020-11-03 DIAGNOSIS — I10 Essential (primary) hypertension: Secondary | ICD-10-CM

## 2020-11-03 DIAGNOSIS — E7800 Pure hypercholesterolemia, unspecified: Secondary | ICD-10-CM

## 2020-11-03 MED ORDER — ALPRAZOLAM 0.5 MG PO TABS: 0.5000 mg | ORAL_TABLET | Freq: Three times a day (TID) | ORAL | 1 refills | Status: DC | PRN

## 2020-11-03 MED ORDER — BUPROPION HCL ER (XL) 150 MG PO TB24: 150.0000 mg | ORAL_TABLET | Freq: Every day | ORAL | 3 refills | Status: DC

## 2020-11-03 MED ORDER — PRAMIPEXOLE DIHYDROCHLORIDE 0.5 MG PO TABS: 0.5000 mg | ORAL_TABLET | Freq: Every day | ORAL | 2 refills | Status: DC

## 2020-11-03 NOTE — Progress Notes (Signed)
Subjective:    Patient ID: [name] [name], female    DOB: 1966/01/12, 55 y.o.   MRN: [id number]  HPI Patient states that the Wellbutrin is helping her depression.  She would like to continue that.  Please see my last office visit.  I saw the patient in January and at that time she was grieving the loss of her husband.  She feels that the Wellbutrin is beneficial and she would like to continue on that medication.  So I will be happy to refill it.  She also request a refill on her Xanax.  While she is here today she would like to get fasting lab work.  She has a history of hypothyroidism.  Her last TSH was checked in January and was normal.  She does have a history of hyperlipidemia and therefore I would like to get her cholesterol while she is here.  However her biggest concern is pain in her right leg.  She reports an aching throbbing pain in her right shin every night when she tries to sleep.  If she gets up and rubs her leg or walks, the pain will go away.  She denies any claudication.  She denies any neuropathy during the day.  She denies any numbness or tingling.  There is no palpable abnormalities along the shin.  There is no palpable masses.  There is no tenderness to palpation along the shin.  She has full and painless range of motion in her knee and her ankle.  She is walking quite a bit on concrete floors due to her job.  She also reports 3-4 severe migraines a month.  She states that the Topamax is not working.  However she is only taking 25 mg a day of Topamax.  She is not taking 50 twice a day as she is supposed to.  She reports a pulsatile headache 2-3 times a week with light sensitivity and then 3-4 severe headaches a month Past Medical History:  Diagnosis Date   AAA (abdominal aortic aneurysm) (HCC)    3.6 cm 04/2016   Anxiety    Cough    Depression    Hypertension    Hypothyroid 11/28/2016   Hypothyroidism    Kidney stone    Nicotine dependence, cigarettes, with other nicotine-induced  disorders    Nocturia    OSA on CPAP    AHI 49.3, cpap at 10 cm H2O (07/05/16)   Ovarian cancer ([place])    Pneumonia    Sleep apnea    Thyroid disease    Tonsillar and adenoid hypertrophy    Past Surgical History:  Procedure Laterality Date   ABDOMINAL HYSTERECTOMY  2003   CHOLECYSTECTOMY     COLONOSCOPY N/A 12/27/2016   Procedure: COLONOSCOPY;  Surgeon: Rogene Houston, MD;  [facility];  Service: Endoscopy;  Laterality: N/A;  2:15   HEMORRHOID SURGERY  2007   SEPTOPLASTY N/A 08/24/2014   Procedure: SEPTOPLASTY;  Surgeon: Jodi Marble, MD;  Location: [facility];  Service: ENT;  Laterality: N/A;   TONSILLECTOMY N/A 08/24/2014   Procedure: TONSILLECTOMY;  Surgeon: Jodi Marble, MD;  Location: [facility];  Service: ENT;  Laterality: N/A;   TUBAL LIGATION     TURBINATE REDUCTION N/A 08/24/2014   Procedure: TURBINATE REDUCTION AND UVULECTOMY;  Surgeon: Jodi Marble, MD;  Location: [facility];  Service: ENT;  Laterality: N/A;   Current Outpatient Medications on File Prior to Visit  Medication Sig Dispense Refill   ALPRAZolam (XANAX) 0.5 MG  tablet Take 1 tablet (0.5 mg total) by mouth 3 (three) times daily as needed for anxiety. 60 tablet 1   atorvastatin (LIPITOR) 20 MG tablet TAKE 1 TABLET BY MOUTH EVERY DAY 90 tablet 1   buPROPion (WELLBUTRIN XL) 150 MG 24 hr tablet TAKE 1 TABLET BY MOUTH DAILY 30 tablet 3   DULoxetine (CYMBALTA) 60 MG capsule TAKE ONE CAPSULE BY MOUTH DAILY - needs office visit FOR more refills 90 capsule 3   hydrochlorothiazide (HYDRODIURIL) 25 MG tablet Take 1 tablet (25 mg total) by mouth daily. APPT NEEDED FOR FUTURE REFILLS 30 tablet 1   ibuprofen (ADVIL,MOTRIN) 200 MG tablet Take 800 mg by mouth every 8 (eight) hours as needed for headache.     levothyroxine (SYNTHROID) 125 MCG tablet TAKE 1 TABLET BY MOUTH DAILY 90 tablet 3   losartan (COZAAR) 100 MG tablet Take 1 tablet (100 mg total) by mouth daily. APPT NEEDED FOR FUTURE REFILLS 30 tablet 1   Multiple Vitamin  (MULTIVITAMIN WITH MINERALS) TABS tablet Take 1 tablet by mouth daily.     Omega-3 Fatty Acids (FISH OIL) 1000 MG CAPS Take 2 capsules (2,000 mg total) by mouth in the morning and at bedtime.     topiramate (TOPAMAX) 25 MG tablet TAKE TWO TABLETS BY MOUTH TWICE DAILY 120 tablet 1   No current facility-administered medications on file prior to visit.   Allergies  Allergen Reactions   Other Other (See Comments)    IV CONTRAST caused crazy thoughts and speech   Social History   Socioeconomic History   Marital status: Married    Spouse name: [name]   Number of children: 1   Years of education: 12   Highest education level: Not on file  Occupational History    Employer: [employer]  Tobacco Use   Smoking status: Every Day    Packs/day: 1.00    Years: 17.00    Pack years: 17.00    Types: Cigarettes   Smokeless tobacco: Never  Vaping Use   Vaping Use: Never used  Substance and Sexual Activity   Alcohol use: No   Drug use: No   Sexual activity: Not on file  Other Topics Concern   Not on file  Social History Narrative   Patient is married [name]) and lives at home with her husband.   Patient has one adult child.   Patient is working full-time.   Patient has a high school education.   Patient is left-handed.   Patient drinks a 2 liter per day.   Social Determinants of Health   Financial Resource Strain: Not on file  Food Insecurity: Not on file  Transportation Needs: Not on file  Physical Activity: Not on file  Stress: Not on file  Social Connections: Not on file  Intimate Partner Violence: Not on file   Family History  Problem Relation Age of Onset   Parkinson's disease Mother    Heart Problems Father    Thyroid disease Maternal Grandmother    Lymphoma Maternal Grandmother    Colon cancer Neg Hx       Review of Systems  All other systems reviewed and are negative.     Objective:   Physical Exam Vitals reviewed.  Constitutional:      General: She is  not in acute distress.    Appearance: Normal appearance. She is obese. She is not ill-appearing, toxic-appearing or diaphoretic.  HENT:     Head: Normocephalic and atraumatic.  Neck:  Vascular: No carotid bruit.  Cardiovascular:     Rate and Rhythm: Normal rate and regular rhythm.     Pulses: Normal pulses.     Heart sounds: Normal heart sounds. No murmur heard.   No friction rub. No gallop.  Pulmonary:     Effort: Pulmonary effort is normal. No respiratory distress.     Breath sounds: Normal breath sounds. No stridor. No wheezing, rhonchi or rales.  Chest:     Chest wall: No tenderness.  Musculoskeletal:     Cervical back: Neck supple.     Right lower leg: No edema.     Left lower leg: No edema.  Lymphadenopathy:     Cervical: No cervical adenopathy.  Skin:    General: Skin is warm.     Coloration: Skin is not jaundiced or pale.     Findings: No bruising, erythema, lesion or rash.  Neurological:     General: No focal deficit present.     Mental Status: She is alert and oriented to person, place, and time.     Cranial Nerves: No cranial nerve deficit.     Sensory: No sensory deficit.     Motor: No weakness.     Coordination: Coordination normal.     Gait: Gait normal.     Deep Tendon Reflexes: Reflexes normal.  Psychiatric:        Attention and Perception: Attention normal.        Speech: Speech normal.        Behavior: Behavior normal.        Cognition and Memory: Cognition and memory normal.          Assessment & Plan:  Hypothyroidism, unspecified type - Plan: CBC with Differential/Platelet, COMPLETE METABOLIC PANEL WITH GFR, Lipid panel, TSH  Leg pain, anterior, right - Plan: DG Tibia/Fibula Right  Benign essential HTN  Pure hypercholesterolemia  RLS (restless legs syndrome) Patient has strong pulses in her right leg and no evidence of peripheral vascular disease.  Symptoms sound like either shinsplints or perhaps restless leg syndrome.  I favor restless  leg syndrome.  Begin Mirapex 0.5 mg p.o. nightly.  Also obtain an x-ray of the right leg to rule out any neoplasm in the tibia.  Regarding her migraines, she is not taking the correct dose of Topamax.  Therefore I asked her to gradually increase Topamax to 50 mg twice daily and then recheck in 1 month to see if this is helping.  Hopefully this will help decrease the frequency and severity of the headaches.  If not, consider removing.  Given her history of hyperlipidemia, I will recheck her cholesterol.  I will also recheck her TSH.  I refilled her bupropion and Xanax.

## 2020-11-04 LAB — CBC WITH DIFFERENTIAL/PLATELET
Absolute Monocytes: 353 cells/uL (ref 200–950)
Basophils Absolute: 63 cells/uL (ref 0–200)
Basophils Relative: 1 %
Eosinophils Absolute: 151 cells/uL (ref 15–500)
Eosinophils Relative: 2.4 %
HCT: 39.8 % (ref 35.0–45.0)
Hemoglobin: 13.6 g/dL (ref 11.7–15.5)
Lymphs Abs: 3169 cells/uL (ref 850–3900)
MCH: 31.9 pg (ref 27.0–33.0)
MCHC: 34.2 g/dL (ref 32.0–36.0)
MCV: 93.2 fL (ref 80.0–100.0)
MPV: 9.8 fL (ref 7.5–12.5)
Monocytes Relative: 5.6 %
Neutro Abs: 2564 cells/uL (ref 1500–7800)
Neutrophils Relative %: 40.7 %
Platelets: 269 10*3/uL (ref 140–400)
RBC: 4.27 10*6/uL (ref 3.80–5.10)
RDW: 13.8 % (ref 11.0–15.0)
Total Lymphocyte: 50.3 %
WBC: 6.3 10*3/uL (ref 3.8–10.8)

## 2020-11-04 LAB — COMPLETE METABOLIC PANEL WITH GFR
AG Ratio: 1.4 (calc) (ref 1.0–2.5)
ALT: 19 U/L (ref 6–29)
AST: 22 U/L (ref 10–35)
Albumin: 4 g/dL (ref 3.6–5.1)
Alkaline phosphatase (APISO): 56 U/L (ref 37–153)
BUN: 18 mg/dL (ref 7–25)
CO2: 24 mmol/L (ref 20–32)
Calcium: 9.5 mg/dL (ref 8.6–10.4)
Chloride: 105 mmol/L (ref 98–110)
Creat: 0.68 mg/dL (ref 0.50–1.03)
Globulin: 2.8 g/dL (calc) (ref 1.9–3.7)
Glucose, Bld: 78 mg/dL (ref 65–99)
Potassium: 4.8 mmol/L (ref 3.5–5.3)
Sodium: 139 mmol/L (ref 135–146)
Total Bilirubin: 0.3 mg/dL (ref 0.2–1.2)
Total Protein: 6.8 g/dL (ref 6.1–8.1)
eGFR: 104 mL/min/{1.73_m2} (ref >=60)

## 2020-11-04 LAB — LIPID PANEL
Cholesterol: 151 mg/dL (ref <200)
HDL: 33 mg/dL — ABNORMAL LOW (ref >=50)
LDL Cholesterol (Calc): 88 mg/dL (calc)
Non-HDL Cholesterol (Calc): 118 mg/dL (calc) (ref <130)
Total CHOL/HDL Ratio: 4.6 (calc) (ref <5.0)
Triglycerides: 200 mg/dL — ABNORMAL HIGH (ref <150)

## 2020-11-04 LAB — TSH: TSH: 2.79 mIU/L

## 2020-11-10 ENCOUNTER — Encounter: Payer: Self-pay | Admitting: *Deleted

## 2020-12-21 ENCOUNTER — Other Ambulatory Visit: Payer: Self-pay | Admitting: Family Medicine

## 2020-12-31 ENCOUNTER — Other Ambulatory Visit: Payer: Self-pay | Admitting: Family Medicine

## 2021-01-25 ENCOUNTER — Other Ambulatory Visit: Payer: Self-pay | Admitting: Family Medicine

## 2021-01-26 ENCOUNTER — Other Ambulatory Visit: Payer: Self-pay | Admitting: Family Medicine

## 2021-02-16 ENCOUNTER — Other Ambulatory Visit: Payer: Self-pay | Admitting: Family Medicine

## 2021-02-16 NOTE — Telephone Encounter (Signed)
LOV 11/03/20 Last refill 12/21/20, #60, 1 refill  Please review, thanks!

## 2021-02-26 ENCOUNTER — Other Ambulatory Visit: Payer: Self-pay | Admitting: Family Medicine

## 2021-04-26 ENCOUNTER — Other Ambulatory Visit: Payer: Self-pay | Admitting: Family Medicine

## 2021-04-26 NOTE — Telephone Encounter (Signed)
LOV 11/03/20 ?Last refill 02/16/21, #60, 1 refills ? ?Please review, thanks! ? ?

## 2021-04-27 ENCOUNTER — Other Ambulatory Visit: Payer: Self-pay | Admitting: Family Medicine

## 2021-05-06 ENCOUNTER — Other Ambulatory Visit: Payer: Self-pay | Admitting: Family Medicine

## 2021-06-25 ENCOUNTER — Other Ambulatory Visit: Payer: Self-pay | Admitting: Family Medicine

## 2021-06-25 NOTE — Telephone Encounter (Signed)
Requested medication (s) are due for refill today: Yes  Requested medication (s) are on the active medication list: Yes  Last refill:  04/26/21  Future visit scheduled: No  Notes to clinic:  No protocol attached.    Requested Prescriptions  Pending Prescriptions Disp Refills   ALPRAZolam (XANAX) 0.5 MG tablet [Pharmacy Med Name: alprazolam 0.5 mg tablet] 60 tablet 1    Sig: TAKE 1 TABLET BY MOUTH THREE TIMES DAILY AS NEEDED FOR ANXIETY     There is no refill protocol information for this order

## 2021-06-29 NOTE — Telephone Encounter (Signed)
Please call pt to make an CPE for medication review/refills.

## 2021-07-05 ENCOUNTER — Other Ambulatory Visit: Payer: Self-pay | Admitting: Family Medicine

## 2021-07-05 NOTE — Telephone Encounter (Signed)
Requested Prescriptions  Pending Prescriptions Disp Refills  . losartan (COZAAR) 100 MG tablet [Pharmacy Med Name: losartan 100 mg tablet] 30 tablet 1    Sig: TAKE 1 TABLET BY MOUTH EVERY DAY     Cardiovascular:  Angiotensin Receptor Blockers Failed - 07/05/2021 11:09 AM      Failed - Cr in normal range and within 180 days    Creat  Date Value Ref Range Status  11/03/2020 0.68 0.50 - 1.03 mg/dL Final         Failed - K in normal range and within 180 days    Potassium  Date Value Ref Range Status  11/03/2020 4.8 3.5 - 5.3 mmol/L Final         Failed - Valid encounter within last 6 months    Recent Outpatient Visits          8 months ago Hypothyroidism, unspecified type   [place] Susy Frizzle, MD   1 year ago Hypothyroidism, unspecified type   [place] Susy Frizzle, MD   2 years ago General medical exam   [place] Susy Frizzle, MD   3 years ago General medical exam   [place] Susy Frizzle, MD   4 years ago Bronchitis, acute, with bronchospasm   [place], Cammie Mcgee, MD             Passed - Patient is not pregnant      Passed - Last BP in normal range    BP Readings from Last 1 Encounters:  11/03/20 132/76         . buPROPion (WELLBUTRIN XL) 150 MG 24 hr tablet [Pharmacy Med Name: bupropion HCl XL 150 mg 24 hr tablet, extended release] 30 tablet 3    Sig: TAKE 1 TABLET BY MOUTH DAILY     Psychiatry: Antidepressants - bupropion Failed - 07/05/2021 11:09 AM      Failed - Valid encounter within last 6 months    Recent Outpatient Visits          8 months ago Hypothyroidism, unspecified type   [place] Susy Frizzle, MD   1 year ago Hypothyroidism, unspecified type   [place] Susy Frizzle, MD   2 years ago General medical exam   [place] Susy Frizzle, MD   3 years ago  General medical exam   [place] Susy Frizzle, MD   4 years ago Bronchitis, acute, with bronchospasm   [place] Pickard, Cammie Mcgee, MD             Passed - Cr in normal range and within 360 days    Creat  Date Value Ref Range Status  11/03/2020 0.68 0.50 - 1.03 mg/dL Final         Passed - AST in normal range and within 360 days    AST  Date Value Ref Range Status  11/03/2020 22 10 - 35 U/L Final         Passed - ALT in normal range and within 360 days    ALT  Date Value Ref Range Status  11/03/2020 19 6 - 29 U/L Final         Passed - Last BP in normal range    BP Readings from Last 1 Encounters:  11/03/20 132/76         .  hydrochlorothiazide (HYDRODIURIL) 25 MG tablet [Pharmacy Med Name: hydrochlorothiazide 25 mg tablet] 30 tablet 1    Sig: TAKE 1 TABLET BY MOUTH EVERY DAY     Cardiovascular: Diuretics - Thiazide Failed - 07/05/2021 11:09 AM      Failed - Cr in normal range and within 180 days    Creat  Date Value Ref Range Status  11/03/2020 0.68 0.50 - 1.03 mg/dL Final         Failed - K in normal range and within 180 days    Potassium  Date Value Ref Range Status  11/03/2020 4.8 3.5 - 5.3 mmol/L Final         Failed - Na in normal range and within 180 days    Sodium  Date Value Ref Range Status  11/03/2020 139 135 - 146 mmol/L Final         Failed - Valid encounter within last 6 months    Recent Outpatient Visits          8 months ago Hypothyroidism, unspecified type   [place] Susy Frizzle, MD   1 year ago Hypothyroidism, unspecified type   [place] Susy Frizzle, MD   2 years ago General medical exam   [place] Susy Frizzle, MD   3 years ago General medical exam   [place] Susy Frizzle, MD   4 years ago Bronchitis, acute, with bronchospasm   [place], Cammie Mcgee, MD              Passed - Last BP in normal range    BP Readings from Last 1 Encounters:  11/03/20 132/76

## 2021-08-04 ENCOUNTER — Other Ambulatory Visit: Payer: Self-pay | Admitting: Family Medicine

## 2021-08-06 NOTE — Telephone Encounter (Signed)
Rx 01/25/21 #90 2RF- too soon Requested Prescriptions  Pending Prescriptions Disp Refills  . pramipexole (MIRAPEX) 0.5 MG tablet [Pharmacy Med Name: pramipexole 0.5 mg tablet] 90 tablet 2    Sig: TAKE 1 TABLET BY MOUTH AT BEDTIME     Neurology:  Parkinsonian Agents Passed - 08/04/2021  8:04 AM      Passed - Last BP in normal range    BP Readings from Last 1 Encounters:  11/03/20 132/76         Passed - Last Heart Rate in normal range    Pulse Readings from Last 1 Encounters:  11/03/20 90         Passed - Valid encounter within last 12 months    Recent Outpatient Visits          9 months ago Hypothyroidism, unspecified type   [place] Susy Frizzle, MD   1 year ago Hypothyroidism, unspecified type   [place] Susy Frizzle, MD   2 years ago General medical exam   [place] Susy Frizzle, MD   3 years ago General medical exam   [place] Susy Frizzle, MD   4 years ago Bronchitis, acute, with bronchospasm   [place] Pickard, Cammie Mcgee, MD

## 2021-09-09 ENCOUNTER — Other Ambulatory Visit: Payer: Self-pay | Admitting: Family Medicine

## 2021-09-10 NOTE — Telephone Encounter (Signed)
Appointment 09/28/21- courtesy RF #30 given Requested Prescriptions  Pending Prescriptions Disp Refills  . losartan (COZAAR) 100 MG tablet [Pharmacy Med Name: losartan 100 mg tablet] 30 tablet 0    Sig: TAKE 1 TABLET BY MOUTH EVERY DAY     Cardiovascular:  Angiotensin Receptor Blockers Failed - 09/09/2021  8:05 AM      Failed - Cr in normal range and within 180 days    Creat  Date Value Ref Range Status  11/03/2020 0.68 0.50 - 1.03 mg/dL Final         Failed - K in normal range and within 180 days    Potassium  Date Value Ref Range Status  11/03/2020 4.8 3.5 - 5.3 mmol/L Final         Failed - Valid encounter within last 6 months    Recent Outpatient Visits          10 months ago Hypothyroidism, unspecified type   [place] Susy Frizzle, MD   1 year ago Hypothyroidism, unspecified type   [place] Susy Frizzle, MD   2 years ago General medical exam   [place] Susy Frizzle, MD   3 years ago General medical exam   [place] Susy Frizzle, MD   4 years ago Bronchitis, acute, with bronchospasm   [place], Cammie Mcgee, MD      Future Appointments            In 2 weeks Dennard Schaumann, Cammie Mcgee, MD [place], [place] - Patient is not pregnant      Passed - Last BP in normal range    BP Readings from Last 1 Encounters:  11/03/20 132/76         . hydrochlorothiazide (HYDRODIURIL) 25 MG tablet [Pharmacy Med Name: hydrochlorothiazide 25 mg tablet] 30 tablet 0    Sig: TAKE 1 TABLET BY MOUTH EVERY DAY     Cardiovascular: Diuretics - Thiazide Failed - 09/09/2021  8:05 AM      Failed - Cr in normal range and within 180 days    Creat  Date Value Ref Range Status  11/03/2020 0.68 0.50 - 1.03 mg/dL Final         Failed - K in normal range and within 180 days    Potassium  Date Value Ref Range Status  11/03/2020 4.8 3.5 - 5.3  mmol/L Final         Failed - Na in normal range and within 180 days    Sodium  Date Value Ref Range Status  11/03/2020 139 135 - 146 mmol/L Final         Failed - Valid encounter within last 6 months    Recent Outpatient Visits          10 months ago Hypothyroidism, unspecified type   [place] Susy Frizzle, MD   1 year ago Hypothyroidism, unspecified type   [place] Susy Frizzle, MD   2 years ago General medical exam   [place] Susy Frizzle, MD   3 years ago General medical exam   [place] Susy Frizzle, MD   4 years ago Bronchitis, acute, with bronchospasm   [place] Pickard, Cammie Mcgee, MD      Future Appointments  In 2 weeks Susy Frizzle, MD [place], [facility]           Passed - Last BP in normal range    BP Readings from Last 1 Encounters:  11/03/20 132/76

## 2021-09-14 ENCOUNTER — Other Ambulatory Visit: Payer: Self-pay | Admitting: Family Medicine

## 2021-09-14 ENCOUNTER — Telehealth: Payer: Self-pay

## 2021-09-14 MED ORDER — ALPRAZOLAM 0.5 MG PO TABS: 0.5000 mg | ORAL_TABLET | Freq: Three times a day (TID) | ORAL | 1 refills | Status: DC | PRN

## 2021-09-14 NOTE — Telephone Encounter (Signed)
Pt called requesting refill on her Alprazolam. Last RF 04/26/2021, Last OV was 11/03/2020. Pt uses [facility]. Thank you.

## 2021-09-28 ENCOUNTER — Ambulatory Visit (INDEPENDENT_AMBULATORY_CARE_PROVIDER_SITE_OTHER): Payer: No Typology Code available for payment source | Admitting: Family Medicine

## 2021-09-28 VITALS — BP 124/84 | HR 85 | Temp 98.7°F | Ht 63.0 in | Wt 195.0 lb

## 2021-09-28 DIAGNOSIS — E039 Hypothyroidism, unspecified: Secondary | ICD-10-CM | POA: Diagnosis not present

## 2021-09-28 DIAGNOSIS — Z780 Asymptomatic menopausal state: Secondary | ICD-10-CM

## 2021-09-28 DIAGNOSIS — Z23 Encounter for immunization: Secondary | ICD-10-CM | POA: Diagnosis not present

## 2021-09-28 DIAGNOSIS — I10 Essential (primary) hypertension: Secondary | ICD-10-CM

## 2021-09-28 DIAGNOSIS — I714 Abdominal aortic aneurysm, without rupture, unspecified: Secondary | ICD-10-CM

## 2021-09-28 DIAGNOSIS — E7800 Pure hypercholesterolemia, unspecified: Secondary | ICD-10-CM

## 2021-09-28 DIAGNOSIS — Z0000 Encounter for general adult medical examination without abnormal findings: Secondary | ICD-10-CM

## 2021-09-28 DIAGNOSIS — Z0001 Encounter for general adult medical examination with abnormal findings: Secondary | ICD-10-CM | POA: Diagnosis not present

## 2021-09-28 DIAGNOSIS — F17200 Nicotine dependence, unspecified, uncomplicated: Secondary | ICD-10-CM | POA: Diagnosis not present

## 2021-09-28 NOTE — Addendum Note (Signed)
Addended by: Randal Buba K on: 09/28/2021 12:53 PM   Modules accepted: Orders

## 2021-09-28 NOTE — Progress Notes (Signed)
Subjective:    Patient ID: [name] [name], female    DOB: 10/28/1965, 56 y.o.   MRN: [id number]  HPI Patient is a very pleasant 56 year old Caucasian female who presents today for physical exam.  She just had her mammogram performed yesterday.  Results are not back yet.  She had a colonoscopy in 2018.  Due to the presence of polyps they recommended a repeat colonoscopy in 5 years.  Therefore she is due now.  She declines this.  She had a hysterectomy for ovarian cancer in 2003.  She does not require Pap smear.  She continues to smoke.  She has no desire to quit.  She also has a history of an abdominal aortic aneurysm.  Due to premature menopause in her early 54s, she is due for a bone density test. Past Medical History:  Diagnosis Date   AAA (abdominal aortic aneurysm)    3.6 cm 04/2016   Anxiety    Cough    Depression    Hypertension    Hypothyroid 11/28/2016   Hypothyroidism    Kidney stone    Nicotine dependence, cigarettes, with other nicotine-induced disorders    Nocturia    OSA on CPAP    AHI 49.3, cpap at 10 cm H2O (07/05/16)   Ovarian cancer ([place])    Pneumonia    Sleep apnea    Thyroid disease    Tonsillar and adenoid hypertrophy    Past Surgical History:  Procedure Laterality Date   ABDOMINAL HYSTERECTOMY  2003   CHOLECYSTECTOMY     COLONOSCOPY N/A 12/27/2016   Procedure: COLONOSCOPY;  Surgeon: Rogene Houston, MD;  [facility];  Service: Endoscopy;  Laterality: N/A;  2:15   HEMORRHOID SURGERY  2007   SEPTOPLASTY N/A 08/24/2014   Procedure: SEPTOPLASTY;  Surgeon: Jodi Marble, MD;  Location: [facility];  Service: ENT;  Laterality: N/A;   TONSILLECTOMY N/A 08/24/2014   Procedure: TONSILLECTOMY;  Surgeon: Jodi Marble, MD;  Location: [facility];  Service: ENT;  Laterality: N/A;   TUBAL LIGATION     TURBINATE REDUCTION N/A 08/24/2014   Procedure: TURBINATE REDUCTION AND UVULECTOMY;  Surgeon: Jodi Marble, MD;  Location: [facility];  Service: ENT;  Laterality: N/A;   Current  Outpatient Medications on File Prior to Visit  Medication Sig Dispense Refill   ALPRAZolam (XANAX) 0.5 MG tablet Take 1 tablet (0.5 mg total) by mouth 3 (three) times daily as needed. for anxiety 60 tablet 1   atorvastatin (LIPITOR) 20 MG tablet TAKE 1 TABLET BY MOUTH EVERY DAY 90 tablet 3   buPROPion (WELLBUTRIN XL) 150 MG 24 hr tablet TAKE 1 TABLET BY MOUTH DAILY 30 tablet 3   DULoxetine (CYMBALTA) 60 MG capsule TAKE ONE CAPSULE BY MOUTH EVERY DAY 90 capsule 3   hydrochlorothiazide (HYDRODIURIL) 25 MG tablet TAKE 1 TABLET BY MOUTH EVERY DAY 30 tablet 0   ibuprofen (ADVIL,MOTRIN) 200 MG tablet Take 800 mg by mouth every 8 (eight) hours as needed for headache.     levothyroxine (SYNTHROID) 125 MCG tablet TAKE 1 TABLET BY MOUTH DAILY 90 tablet 3   losartan (COZAAR) 100 MG tablet TAKE 1 TABLET BY MOUTH EVERY DAY 30 tablet 0   Multiple Vitamin (MULTIVITAMIN WITH MINERALS) TABS tablet Take 1 tablet by mouth daily.     Omega-3 Fatty Acids (FISH OIL) 1000 MG CAPS Take 2 capsules (2,000 mg total) by mouth in the morning and at bedtime.     pramipexole (MIRAPEX) 0.5 MG tablet TAKE 1  TABLET BY MOUTH AT BEDTIME 90 tablet 2   topiramate (TOPAMAX) 25 MG tablet TAKE TWO TABLETS BY MOUTH TWICE DAILY 120 tablet 1   No current facility-administered medications on file prior to visit.   Allergies  Allergen Reactions   Other Other (See Comments)    IV CONTRAST caused crazy thoughts and speech   Social History   Socioeconomic History   Marital status: Married    Spouse name: [name]   Number of children: 1   Years of education: 12   Highest education level: Not on file  Occupational History    Employer: [employer]  Tobacco Use   Smoking status: Every Day    Packs/day: 1.00    Years: 17.00    Total pack years: 17.00    Types: Cigarettes   Smokeless tobacco: Never  Vaping Use   Vaping Use: Never used  Substance and Sexual Activity   Alcohol use: No   Drug use: No   Sexual activity: Not on  file  Other Topics Concern   Not on file  Social History Narrative   Patient is married [name]) and lives at home with her husband.   Patient has one adult child.   Patient is working full-time.   Patient has a high school education.   Patient is left-handed.   Patient drinks a 2 liter per day.   Social Determinants of Health   Financial Resource Strain: Not on file  Food Insecurity: Not on file  Transportation Needs: Not on file  Physical Activity: Not on file  Stress: Not on file  Social Connections: Not on file  Intimate Partner Violence: Not on file   Family History  Problem Relation Age of Onset   Parkinson's disease Mother    Heart Problems Father    Thyroid disease Maternal Grandmother    Lymphoma Maternal Grandmother    Colon cancer Neg Hx       Review of Systems  All other systems reviewed and are negative.      Objective:   Physical Exam Vitals reviewed.  Constitutional:      General: She is not in acute distress.    Appearance: Normal appearance. She is obese. She is not ill-appearing, toxic-appearing or diaphoretic.  HENT:     Head: Normocephalic and atraumatic.  Neck:     Vascular: No carotid bruit.  Cardiovascular:     Rate and Rhythm: Normal rate and regular rhythm.     Pulses: Normal pulses.     Heart sounds: Normal heart sounds. No murmur heard.    No friction rub. No gallop.  Pulmonary:     Effort: Pulmonary effort is normal. No respiratory distress.     Breath sounds: Normal breath sounds. No stridor. No wheezing, rhonchi or rales.  Chest:     Chest wall: No tenderness.  Musculoskeletal:     Cervical back: Neck supple.     Right lower leg: No edema.     Left lower leg: No edema.  Lymphadenopathy:     Cervical: No cervical adenopathy.  Skin:    General: Skin is warm.     Coloration: Skin is not jaundiced or pale.     Findings: No bruising, erythema, lesion or rash.  Neurological:     General: No focal deficit present.     Mental  Status: She is alert and oriented to person, place, and time.     Cranial Nerves: No cranial nerve deficit.     Sensory: No sensory  deficit.     Motor: No weakness.     Coordination: Coordination normal.     Gait: Gait normal.     Deep Tendon Reflexes: Reflexes normal.  Psychiatric:        Attention and Perception: Attention normal.        Speech: Speech normal.        Behavior: Behavior normal.        Cognition and Memory: Cognition and memory normal.           Assessment & Plan:  Hypothyroidism, unspecified type - Plan: CBC with Differential/Platelet, COMPLETE METABOLIC PANEL WITH GFR, Lipid panel, TSH  Benign essential HTN - Plan: CBC with Differential/Platelet, COMPLETE METABOLIC PANEL WITH GFR, Lipid panel, TSH  Postmenopausal estrogen deficiency - Plan: DG Bone Density  Abdominal aortic aneurysm (AAA) without rupture, unspecified part (HCC) - Plan: VAS [place] AAA DUPLEX  Pure hypercholesterolemia  Smoker  General medical exam Patient states patient is doing well on the combination of Wellbutrin, Cymbalta, and Xanax.  She has no desire to quit.  She states that her restless leg is managed with Mirapex.  Her headaches are controlled on Topamax.  Her blood pressure is acceptable today at 124/84.  Her mammogram is up-to-date.  She refuses a colonoscopy.  She consents to a bone density test.  I did recommend 1200 mg a day of calcium 1000 units a day of vitamin D.  I recommended smoking cessation but she is in the precontemplative phase.  I will start screening the patient for lung cancer next year.  Check an ultrasound to monitor for AAA.  Schedule her for a bone density test given her premature menopause.  Check CBC, CMP, lipid panel, and TSH.  Patient receive a flu shot today.  Recommended a COVID booster

## 2021-09-29 LAB — COMPLETE METABOLIC PANEL WITH GFR
AG Ratio: 1.5 (calc) (ref 1.0–2.5)
ALT: 18 U/L (ref 6–29)
AST: 20 U/L (ref 10–35)
Albumin: 4.5 g/dL (ref 3.6–5.1)
Alkaline phosphatase (APISO): 53 U/L (ref 37–153)
BUN: 18 mg/dL (ref 7–25)
CO2: 26 mmol/L (ref 20–32)
Calcium: 10.1 mg/dL (ref 8.6–10.4)
Chloride: 102 mmol/L (ref 98–110)
Creat: 0.64 mg/dL (ref 0.50–1.03)
Globulin: 3 g/dL (calc) (ref 1.9–3.7)
Glucose, Bld: 86 mg/dL (ref 65–99)
Potassium: 4.5 mmol/L (ref 3.5–5.3)
Sodium: 137 mmol/L (ref 135–146)
Total Bilirubin: 0.6 mg/dL (ref 0.2–1.2)
Total Protein: 7.5 g/dL (ref 6.1–8.1)
eGFR: 105 mL/min/{1.73_m2} (ref >=60)

## 2021-09-29 LAB — CBC WITH DIFFERENTIAL/PLATELET
Absolute Monocytes: 357 cells/uL (ref 200–950)
Basophils Absolute: 41 cells/uL (ref 0–200)
Basophils Relative: 0.8 %
Eosinophils Absolute: 107 cells/uL (ref 15–500)
Eosinophils Relative: 2.1 %
HCT: 40 % (ref 35.0–45.0)
Hemoglobin: 13.9 g/dL (ref 11.7–15.5)
Lymphs Abs: 2259 cells/uL (ref 850–3900)
MCH: 31.8 pg (ref 27.0–33.0)
MCHC: 34.8 g/dL (ref 32.0–36.0)
MCV: 91.5 fL (ref 80.0–100.0)
MPV: 9.2 fL (ref 7.5–12.5)
Monocytes Relative: 7 %
Neutro Abs: 2336 cells/uL (ref 1500–7800)
Neutrophils Relative %: 45.8 %
Platelets: 264 10*3/uL (ref 140–400)
RBC: 4.37 10*6/uL (ref 3.80–5.10)
RDW: 13.9 % (ref 11.0–15.0)
Total Lymphocyte: 44.3 %
WBC: 5.1 10*3/uL (ref 3.8–10.8)

## 2021-09-29 LAB — LIPID PANEL
Cholesterol: 153 mg/dL (ref <200)
HDL: 35 mg/dL — ABNORMAL LOW (ref >=50)
LDL Cholesterol (Calc): 84 mg/dL (calc)
Non-HDL Cholesterol (Calc): 118 mg/dL (calc) (ref <130)
Total CHOL/HDL Ratio: 4.4 (calc) (ref <5.0)
Triglycerides: 242 mg/dL — ABNORMAL HIGH (ref <150)

## 2021-09-29 LAB — TSH: TSH: 5.06 mIU/L — ABNORMAL HIGH

## 2021-10-04 ENCOUNTER — Encounter: Payer: Self-pay | Admitting: Family Medicine

## 2021-10-11 ENCOUNTER — Other Ambulatory Visit: Payer: Self-pay | Admitting: Family Medicine

## 2021-10-22 ENCOUNTER — Other Ambulatory Visit: Payer: Self-pay | Admitting: Family Medicine

## 2021-10-23 NOTE — Telephone Encounter (Signed)
Requested Prescriptions  Pending Prescriptions Disp Refills  . pramipexole (MIRAPEX) 0.5 MG tablet [Pharmacy Med Name: pramipexole 0.5 mg tablet] 90 tablet 2    Sig: TAKE 1 TABLET BY MOUTH AT BEDTIME     Neurology:  Parkinsonian Agents Passed - 10/22/2021 10:32 AM      Passed - Last BP in normal range    BP Readings from Last 1 Encounters:  09/28/21 124/84         Passed - Last Heart Rate in normal range    Pulse Readings from Last 1 Encounters:  09/28/21 85         Passed - Valid encounter within last 12 months    Recent Outpatient Visits          11 months ago Hypothyroidism, unspecified type   [place] Susy Frizzle, MD   1 year ago Hypothyroidism, unspecified type   [place] Susy Frizzle, MD   2 years ago General medical exam   [place] Susy Frizzle, MD   3 years ago General medical exam   [place] Susy Frizzle, MD   4 years ago Bronchitis, acute, with bronchospasm   [place] Pickard, Cammie Mcgee, MD

## 2021-11-01 ENCOUNTER — Encounter: Payer: Self-pay | Admitting: Family Medicine

## 2021-11-06 ENCOUNTER — Other Ambulatory Visit: Payer: Self-pay | Admitting: Family Medicine

## 2021-11-06 NOTE — Telephone Encounter (Signed)
Requested medication (s) are due for refill today - provider review   Requested medication (s) are on the active medication list -yes  Future visit scheduled -no  Last refill: 09/14/21 #60 1RF  Notes to clinic: non delegated Rx  Requested Prescriptions  Pending Prescriptions Disp Refills   ALPRAZolam (XANAX) 0.5 MG tablet [Pharmacy Med Name: alprazolam 0.5 mg tablet] 60 tablet 1    Sig: TAKE 1 TABLET BY MOUTH THREE TIMES DAILY AS NEEDED FOR ANXIETY     Not Delegated - Psychiatry: Anxiolytics/Hypnotics 2 Failed - 11/06/2021  8:05 AM      Failed - This refill cannot be delegated      Failed - Urine Drug Screen completed in last 360 days      Failed - Valid encounter within last 6 months    Recent Outpatient Visits           1 year ago Hypothyroidism, unspecified type   [place] Susy Frizzle, MD   1 year ago Hypothyroidism, unspecified type   [place] Pickard, Cammie Mcgee, MD   2 years ago General medical exam   [place] Susy Frizzle, MD   3 years ago General medical exam   [place] Susy Frizzle, MD   4 years ago Bronchitis, acute, with bronchospasm   [place], Cammie Mcgee, MD              Passed - Patient is not pregnant         Requested Prescriptions  Pending Prescriptions Disp Refills   ALPRAZolam (XANAX) 0.5 MG tablet [Pharmacy Med Name: alprazolam 0.5 mg tablet] 60 tablet 1    Sig: TAKE 1 TABLET BY MOUTH THREE TIMES DAILY AS NEEDED FOR ANXIETY     Not Delegated - Psychiatry: Anxiolytics/Hypnotics 2 Failed - 11/06/2021  8:05 AM      Failed - This refill cannot be delegated      Failed - Urine Drug Screen completed in last 360 days      Failed - Valid encounter within last 6 months    Recent Outpatient Visits           1 year ago Hypothyroidism, unspecified type   [place] Susy Frizzle, MD   1 year ago  Hypothyroidism, unspecified type   [place] Pickard, Cammie Mcgee, MD   2 years ago General medical exam   [place] Susy Frizzle, MD   3 years ago General medical exam   [place] Susy Frizzle, MD   4 years ago Bronchitis, acute, with bronchospasm   [place], Cammie Mcgee, MD              Passed - Patient is not pregnant

## 2021-11-11 ENCOUNTER — Other Ambulatory Visit: Payer: Self-pay | Admitting: Family Medicine

## 2021-11-12 NOTE — Telephone Encounter (Signed)
Requested Prescriptions  Pending Prescriptions Disp Refills  . hydrochlorothiazide (HYDRODIURIL) 25 MG tablet [Pharmacy Med Name: hydrochlorothiazide 25 mg tablet] 30 tablet 2    Sig: TAKE 1 TABLET BY MOUTH EVERY DAY     Cardiovascular: Diuretics - Thiazide Failed - 11/11/2021  8:04 AM      Failed - Valid encounter within last 6 months    Recent Outpatient Visits          1 year ago Hypothyroidism, unspecified type   [place] Pickard, Cammie Mcgee, MD   1 year ago Hypothyroidism, unspecified type   [place] Pickard, Cammie Mcgee, MD   2 years ago General medical exam   [place] Susy Frizzle, MD   3 years ago General medical exam   [place] Susy Frizzle, MD   4 years ago Bronchitis, acute, with bronchospasm   [place] Pickard, Cammie Mcgee, MD             Passed - Cr in normal range and within 180 days    Creat  Date Value Ref Range Status  09/28/2021 0.64 0.50 - 1.03 mg/dL Final         Passed - K in normal range and within 180 days    Potassium  Date Value Ref Range Status  09/28/2021 4.5 3.5 - 5.3 mmol/L Final         Passed - Na in normal range and within 180 days    Sodium  Date Value Ref Range Status  09/28/2021 137 135 - 146 mmol/L Final         Passed - Last BP in normal range    BP Readings from Last 1 Encounters:  09/28/21 124/84         . losartan (COZAAR) 100 MG tablet [Pharmacy Med Name: losartan 100 mg tablet] 30 tablet 2    Sig: TAKE 1 TABLET BY MOUTH EVERY DAY     Cardiovascular:  Angiotensin Receptor Blockers Failed - 11/11/2021  8:04 AM      Failed - Valid encounter within last 6 months    Recent Outpatient Visits          1 year ago Hypothyroidism, unspecified type   [place] Susy Frizzle, MD   1 year ago Hypothyroidism, unspecified type   [place] Pickard, Cammie Mcgee, MD   2 years ago General  medical exam   [place] Susy Frizzle, MD   3 years ago General medical exam   [place] Susy Frizzle, MD   4 years ago Bronchitis, acute, with bronchospasm   [place] Pickard, Cammie Mcgee, MD             Passed - Cr in normal range and within 180 days    Creat  Date Value Ref Range Status  09/28/2021 0.64 0.50 - 1.03 mg/dL Final         Passed - K in normal range and within 180 days    Potassium  Date Value Ref Range Status  09/28/2021 4.5 3.5 - 5.3 mmol/L Final         Passed - Patient is not pregnant      Passed - Last BP in normal range    BP Readings from Last 1 Encounters:  09/28/21 124/84

## 2021-11-30 ENCOUNTER — Encounter (INDEPENDENT_AMBULATORY_CARE_PROVIDER_SITE_OTHER): Payer: Self-pay | Admitting: *Deleted

## 2022-01-06 ENCOUNTER — Other Ambulatory Visit: Payer: Self-pay | Admitting: Family Medicine

## 2022-01-10 ENCOUNTER — Other Ambulatory Visit: Payer: Self-pay | Admitting: Family Medicine

## 2022-01-10 NOTE — Telephone Encounter (Signed)
Requested Prescriptions  Pending Prescriptions Disp Refills   DULoxetine (CYMBALTA) 60 MG capsule [Pharmacy Med Name: duloxetine 60 mg capsule,delayed release] 90 capsule 0    Sig: TAKE ONE CAPSULE BY MOUTH EVERY DAY     Psychiatry: Antidepressants - SNRI - duloxetine Failed - 01/10/2022  8:00 AM      Failed - Valid encounter within last 6 months    Recent Outpatient Visits           1 year ago Hypothyroidism, unspecified type   [place] Susy Frizzle, MD   1 year ago Hypothyroidism, unspecified type   [place] Susy Frizzle, MD   2 years ago General medical exam   [place] Susy Frizzle, MD   3 years ago General medical exam   [place] Susy Frizzle, MD   4 years ago Bronchitis, acute, with bronchospasm   [place] Pickard, Cammie Mcgee, MD              Passed - Cr in normal range and within 360 days    Creat  Date Value Ref Range Status  09/28/2021 0.64 0.50 - 1.03 mg/dL Final         Passed - eGFR is 30 or above and within 360 days    GFR, Est African American  Date Value Ref Range Status  01/20/2020 94 > OR = 60 mL/min/1.55m Final   GFR, Est Non African American  Date Value Ref Range Status  01/20/2020 81 > OR = 60 mL/min/1.755mFinal   eGFR  Date Value Ref Range Status  09/28/2021 105 > OR = 60 mL/min/1.7334minal         Passed - Completed PHQ-2 or PHQ-9 in the last 360 days      Passed - Last BP in normal range    BP Readings from Last 1 Encounters:  09/28/21 124/84          buPROPion (WELLBUTRIN XL) 150 MG 24 hr tablet [Pharmacy Med Name: bupropion HCl XL 150 mg 24 hr tablet, extended release] 90 tablet 0    Sig: TAKE 1 TABLET BY MOUTH DAILY     Psychiatry: Antidepressants - bupropion Failed - 01/10/2022  8:00 AM      Failed - Valid encounter within last 6 months    Recent Outpatient Visits           1 year ago Hypothyroidism,  unspecified type   [place]   1 year ago Hypothyroidism, unspecified type   [place]gee[place]   2 years ago General medical exam   [place]   3 years ago General medical exam   [place]   4 years ago Bronchitis, acute, with bronchospasm   [place]              Passed - Cr in normal range and within 360 days    Creat  Date Value Ref Range Status  09/28/2021 0.64 0.50 - 1.03 mg/dL Final         Passed - AST in normal range and within 360 days    AST  Date Value Ref Range Status  09/28/2021 20 10 - 35 U/L Final         Passed - ALT in  normal range and within 360 days    ALT  Date Value Ref Range Status  09/28/2021 18 6 - 29 U/L Final         Passed - Last BP in normal range    BP Readings from Last 1 Encounters:  09/28/21 124/84

## 2022-02-07 ENCOUNTER — Other Ambulatory Visit: Payer: Self-pay | Admitting: Family Medicine

## 2022-02-07 NOTE — Telephone Encounter (Signed)
Last OV 09/28/21. Will need OV for further refills.  Requested Prescriptions  Pending Prescriptions Disp Refills   hydrochlorothiazide (HYDRODIURIL) 25 MG tablet [Pharmacy Med Name: hydrochlorothiazide 25 mg tablet] 90 tablet 0    Sig: TAKE 1 TABLET BY MOUTH EVERY DAY     Cardiovascular: Diuretics - Thiazide Failed - 02/07/2022  8:12 AM      Failed - Valid encounter within last 6 months    Recent Outpatient Visits           1 year ago Hypothyroidism, unspecified type   [place] Susy Frizzle, MD   2 years ago Hypothyroidism, unspecified type   [place] Pickard, Cammie Mcgee, MD   2 years ago General medical exam   [place] Susy Frizzle, MD   4 years ago General medical exam   [place] Susy Frizzle, MD   4 years ago Bronchitis, acute, with bronchospasm   [place] Pickard, Cammie Mcgee, MD              Passed - Cr in normal range and within 180 days    Creat  Date Value Ref Range Status  09/28/2021 0.64 0.50 - 1.03 mg/dL Final         Passed - K in normal range and within 180 days    Potassium  Date Value Ref Range Status  09/28/2021 4.5 3.5 - 5.3 mmol/L Final         Passed - Na in normal range and within 180 days    Sodium  Date Value Ref Range Status  09/28/2021 137 135 - 146 mmol/L Final         Passed - Last BP in normal range    BP Readings from Last 1 Encounters:  09/28/21 124/84          losartan (COZAAR) 100 MG tablet [Pharmacy Med Name: losartan 100 mg tablet] 90 tablet 0    Sig: TAKE 1 TABLET BY MOUTH EVERY DAY     Cardiovascular:  Angiotensin Receptor Blockers Failed - 02/07/2022  8:12 AM      Failed - Valid encounter within last 6 months    Recent Outpatient Visits           1 year ago Hypothyroidism, unspecified type   [place] Susy Frizzle, MD   2 years ago Hypothyroidism, unspecified type   [place] Susy Frizzle, MD   2 years ago General medical exam   [place] Schaumann, Cammie Mcgee, MD   4 years ago General medical exam   [place] Susy Frizzle, MD   4 years ago Bronchitis, acute, with bronchospasm   [place] Pickard, Cammie Mcgee, MD              Passed - Cr in normal range and within 180 days    Creat  Date Value Ref Range Status  09/28/2021 0.64 0.50 - 1.03 mg/dL Final         Passed - K in normal range and within 180 days    Potassium  Date Value Ref Range Status  09/28/2021 4.5 3.5 - 5.3 mmol/L Final         Passed - Patient is not pregnant      Passed - Last BP in normal range    BP Readings from Last 1 Encounters:  09/28/21  124/84           

## 2022-04-04 ENCOUNTER — Other Ambulatory Visit: Payer: Self-pay | Admitting: Family Medicine

## 2022-04-04 NOTE — Telephone Encounter (Signed)
Requested medication (s) are due for refill today -yes  Requested medication (s) are on the active medication list -yes  Future visit scheduled -yes  Last refill: 01/08/22 #60 1RF  Notes to clinic: non delegated Rx  Requested Prescriptions  Pending Prescriptions Disp Refills   ALPRAZolam (XANAX) 0.5 MG tablet [Pharmacy Med Name: alprazolam 0.5 mg tablet] 60 tablet 1    Sig: TAKE 1 TABLET BY MOUTH THREE TIMES DAILY AS NEEDED FOR ANXIETY     Not Delegated - Psychiatry: Anxiolytics/Hypnotics 2 Failed - 04/04/2022  8:03 AM      Failed - This refill cannot be delegated      Failed - Urine Drug Screen completed in last 360 days      Failed - Valid encounter within last 6 months    Recent Outpatient Visits           1 year ago Hypothyroidism, unspecified type   [place] Susy Frizzle, MD   2 years ago Hypothyroidism, unspecified type   [place] Pickard, Cammie Mcgee, MD   3 years ago General medical exam   [place] Dennard Schaumann, Cammie Mcgee, MD   4 years ago General medical exam   [place] Susy Frizzle, MD   4 years ago Bronchitis, acute, with bronchospasm   [place], Cammie Mcgee, MD              Passed - Patient is not pregnant         Requested Prescriptions  Pending Prescriptions Disp Refills   ALPRAZolam (XANAX) 0.5 MG tablet [Pharmacy Med Name: alprazolam 0.5 mg tablet] 60 tablet 1    Sig: TAKE 1 TABLET BY MOUTH THREE TIMES DAILY AS NEEDED FOR ANXIETY     Not Delegated - Psychiatry: Anxiolytics/Hypnotics 2 Failed - 04/04/2022  8:03 AM      Failed - This refill cannot be delegated      Failed - Urine Drug Screen completed in last 360 days      Failed - Valid encounter within last 6 months    Recent Outpatient Visits           1 year ago Hypothyroidism, unspecified type   [place] Susy Frizzle, MD   2 years ago Hypothyroidism,  unspecified type   [place] Pickard, Cammie Mcgee, MD   3 years ago General medical exam   [place] Susy Frizzle, MD   4 years ago General medical exam   [place] Susy Frizzle, MD   4 years ago Bronchitis, acute, with bronchospasm   [place], Cammie Mcgee, MD              Passed - Patient is not pregnant

## 2022-05-06 ENCOUNTER — Other Ambulatory Visit: Payer: Self-pay | Admitting: Family Medicine

## 2022-06-03 ENCOUNTER — Other Ambulatory Visit: Payer: Self-pay | Admitting: Family Medicine

## 2022-06-04 NOTE — Telephone Encounter (Signed)
Requested medication (s) are due for refill today -yes  Requested medication (s) are on the active medication list -yes  Future visit scheduled -no  Last refill: 04/04/22 #60 1RF  Notes to clinic: non delegated Rx  Requested Prescriptions  Pending Prescriptions Disp Refills   ALPRAZolam (XANAX) 0.5 MG tablet [Pharmacy Med Name: alprazolam 0.5 mg tablet] 60 tablet 1    Sig: TAKE 1 TABLET BY MOUTH THREE TIMES DAILY AS NEEDED FOR ANXIETY     Not Delegated - Psychiatry: Anxiolytics/Hypnotics 2 Failed - 06/03/2022  8:02 AM      Failed - This refill cannot be delegated      Failed - Urine Drug Screen completed in last 360 days      Failed - Valid encounter within last 6 months    Recent Outpatient Visits           1 year ago Hypothyroidism, unspecified type   [facility] Medicine Donita Brooks, MD   2 years ago Hypothyroidism, unspecified type   [facility] Medicine Pickard, Priscille Heidelberg, MD   3 years ago General medical exam   [facility] Medicine Tanya Nones, Priscille Heidelberg, MD   4 years ago General medical exam   [facility] Medicine Donita Brooks, MD   4 years ago Bronchitis, acute, with bronchospasm   [facility] Medicine Pickard, Priscille Heidelberg, MD              Passed - Patient is not pregnant         Requested Prescriptions  Pending Prescriptions Disp Refills   ALPRAZolam (XANAX) 0.5 MG tablet [Pharmacy Med Name: alprazolam 0.5 mg tablet] 60 tablet 1    Sig: TAKE 1 TABLET BY MOUTH THREE TIMES DAILY AS NEEDED FOR ANXIETY     Not Delegated - Psychiatry: Anxiolytics/Hypnotics 2 Failed - 06/03/2022  8:02 AM      Failed - This refill cannot be delegated      Failed - Urine Drug Screen completed in last 360 days      Failed - Valid encounter within last 6 months    Recent Outpatient Visits           1 year ago Hypothyroidism, unspecified type   [facility] Medicine Donita Brooks, MD   2 years ago Hypothyroidism,  unspecified type   [facility] Medicine Pickard, Priscille Heidelberg, MD   3 years ago General medical exam   [facility] Medicine Donita Brooks, MD   4 years ago General medical exam   [facility] Medicine Donita Brooks, MD   4 years ago Bronchitis, acute, with bronchospasm   [facility] Family Medicine Pickard, Priscille Heidelberg, MD              Passed - Patient is not pregnant

## 2022-08-01 ENCOUNTER — Other Ambulatory Visit: Payer: Self-pay | Admitting: Family Medicine

## 2022-08-06 ENCOUNTER — Other Ambulatory Visit: Payer: Self-pay | Admitting: Family Medicine

## 2022-08-20 ENCOUNTER — Telehealth: Payer: Self-pay

## 2022-08-20 NOTE — Telephone Encounter (Signed)
Reached out to patient to set up follow up visit with provider to discuss chronic conditions.  Telephone encounter attempt : 1   This is a wrong number for the patient.  [name] [name] [facility] Specialist

## 2022-10-17 ENCOUNTER — Other Ambulatory Visit: Payer: Self-pay | Admitting: Family Medicine

## 2022-12-08 ENCOUNTER — Other Ambulatory Visit: Payer: Self-pay | Admitting: Family Medicine

## 2022-12-09 ENCOUNTER — Other Ambulatory Visit: Payer: Self-pay | Admitting: Family Medicine

## 2022-12-10 NOTE — Telephone Encounter (Signed)
Requested medication (s) are due for refill today - no  Requested medication (s) are on the active medication list -yes  Future visit scheduled -no  Last refill: 10/17/22 #60 1RF  Notes to clinic: non delegated Rx  Requested Prescriptions  Pending Prescriptions Disp Refills   ALPRAZolam (XANAX) 0.5 MG tablet [Pharmacy Med Name: alprazolam 0.5 mg tablet] 60 tablet 1    Sig: TAKE 1 TABLET BY MOUTH THREE TIMES DAILY AS NEEDED FOR ANXIETY     Not Delegated - Psychiatry: Anxiolytics/Hypnotics 2 Failed - 12/08/2022  8:02 AM      Failed - This refill cannot be delegated      Failed - Urine Drug Screen completed in last 360 days      Failed - Valid encounter within last 6 months    Recent Outpatient Visits           2 years ago Hypothyroidism, unspecified type   [facility] Medicine Donita Brooks, MD   2 years ago Hypothyroidism, unspecified type   [facility] Medicine Pickard, Priscille Heidelberg, MD   3 years ago General medical exam   [facility] Family Medicine Tanya Nones, Priscille Heidelberg, MD   4 years ago General medical exam   [facility] Family Medicine Donita Brooks, MD   5 years ago Bronchitis, acute, with bronchospasm   [facility] Family Medicine Pickard, Priscille Heidelberg, MD              Passed - Patient is not pregnant         Requested Prescriptions  Pending Prescriptions Disp Refills   ALPRAZolam (XANAX) 0.5 MG tablet [Pharmacy Med Name: alprazolam 0.5 mg tablet] 60 tablet 1    Sig: TAKE 1 TABLET BY MOUTH THREE TIMES DAILY AS NEEDED FOR ANXIETY     Not Delegated - Psychiatry: Anxiolytics/Hypnotics 2 Failed - 12/08/2022  8:02 AM      Failed - This refill cannot be delegated      Failed - Urine Drug Screen completed in last 360 days      Failed - Valid encounter within last 6 months    Recent Outpatient Visits           2 years ago Hypothyroidism, unspecified type   [facility] Medicine Donita Brooks, MD   2 years ago Hypothyroidism,  unspecified type   [facility] Medicine Pickard, Priscille Heidelberg, MD   3 years ago General medical exam   [facility] Family Medicine Donita Brooks, MD   4 years ago General medical exam   [facility] Medicine Donita Brooks, MD   5 years ago Bronchitis, acute, with bronchospasm   [facility] Family Medicine Pickard, Priscille Heidelberg, MD              Passed - Patient is not pregnant

## 2022-12-10 NOTE — Telephone Encounter (Signed)
Requested medication (s) are due for refill today:   Yes  Requested medication (s) are on the active medication list:   Yes.    Future visit scheduled:   No.   LOV 09/28/2021  Needs CPE   Last ordered: 08/01/2022 #90, 0 refills  Unable to refill because he needs CPE   Requested Prescriptions  Pending Prescriptions Disp Refills   pramipexole (MIRAPEX) 0.5 MG tablet [Pharmacy Med Name: pramipexole 0.5 mg tablet] 90 tablet 0    Sig: TAKE 1 TABLET BY MOUTH AT BEDTIME (PATIENT NEEDS APPOINTMENT)     Neurology:  Parkinsonian Agents Failed - 12/09/2022  8:03 AM      Failed - Valid encounter within last 12 months    Recent Outpatient Visits           2 years ago Hypothyroidism, unspecified type   [facility] Medicine Donita Brooks, MD   2 years ago Hypothyroidism, unspecified type   [facility] Medicine Pickard, Priscille Heidelberg, MD   3 years ago General medical exam   [facility] Medicine Donita Brooks, MD   4 years ago General medical exam   [facility] Medicine Donita Brooks, MD   5 years ago Bronchitis, acute, with bronchospasm   [facility] Medicine Pickard, Priscille Heidelberg, MD              Passed - Last BP in normal range    BP Readings from Last 1 Encounters:  09/28/21 124/84         Passed - Last Heart Rate in normal range    Pulse Readings from Last 1 Encounters:  09/28/21 85

## 2022-12-31 ENCOUNTER — Telehealth: Payer: Self-pay

## 2022-12-31 NOTE — Telephone Encounter (Signed)
Last RF Xanax 10/17/2022. Last RF Mirapex 08/01/22. Last OV 09/28/2021. No current appointment scheduled for visit.   Copied from CRM [id number]. Topic: Clinical - Medication Refill >> Dec 31, 2022  8:12 AM Dollene Primrose wrote: Most Recent Primary Care Visit:  Provider: Lynnea Ferrier T  Department[facility]  Visit Type: PHYSICAL  Date: 09/28/2021  Medication:  1-ALPRAZolam (XANAX) 0.5 MG tablet  2-pramipexole (MIRAPEX) 0.5 MG tablet  Has the patient contacted their pharmacy? Yes-told PCP needs to resend (Agent: If no, request that the patient contact the pharmacy for the refill. If patient does not wish to contact the pharmacy document the reason why and proceed with request.) (Agent: If yes, when and what did the pharmacy advise?)  Is this the correct pharmacy for this prescription? yes If no, delete pharmacy and type the correct one.  This is the patient's preferred pharmacy:  [facility] - [place], [place] - [address] [place] [address] Phone: [phone] Fax: [phone]   Has the prescription been filled recently? yes  Is the patient out of the medication? yes  Has the patient been seen for an appointment in the last year OR does the patient have an upcoming appointment? yes  Can we respond through MyChart? yes  Agent: Please be advised that Rx refills may take up to 3 business days. We ask that you follow-up with your pharmacy.

## 2023-01-01 ENCOUNTER — Telehealth: Payer: Self-pay

## 2023-01-01 NOTE — Telephone Encounter (Signed)
Copied from CRM [id number]. Topic: Appointments - Scheduling Inquiry for Clinic >> Jan 01, 2023  2:04 PM Bo Mcclintock wrote: Reason for CRM: Pt called in to get prescription refills on 12/17 & Dr.Pickard stated that she has to schedule an appointment for the refills. Pt states that her symptoms are worsening and she is unable to wait for the next available date which is 12/30. Pt is a requesting a callback from provider or nurse at [phone]

## 2023-01-02 ENCOUNTER — Other Ambulatory Visit: Payer: Self-pay | Admitting: Family Medicine

## 2023-01-02 MED ORDER — ALPRAZOLAM 0.5 MG PO TABS: 0.5000 mg | ORAL_TABLET | Freq: Three times a day (TID) | ORAL | 1 refills | Status: DC | PRN

## 2023-01-13 ENCOUNTER — Ambulatory Visit: Payer: 59 | Admitting: Family Medicine

## 2023-01-23 ENCOUNTER — Ambulatory Visit (INDEPENDENT_AMBULATORY_CARE_PROVIDER_SITE_OTHER): Payer: 59 | Admitting: Family Medicine

## 2023-01-23 VITALS — BP 120/82 | HR 97 | Temp 97.8°F | Ht 63.0 in | Wt 236.0 lb

## 2023-01-23 DIAGNOSIS — R011 Cardiac murmur, unspecified: Secondary | ICD-10-CM

## 2023-01-23 DIAGNOSIS — E7800 Pure hypercholesterolemia, unspecified: Secondary | ICD-10-CM | POA: Diagnosis not present

## 2023-01-23 DIAGNOSIS — F17200 Nicotine dependence, unspecified, uncomplicated: Secondary | ICD-10-CM | POA: Diagnosis not present

## 2023-01-23 DIAGNOSIS — I10 Essential (primary) hypertension: Secondary | ICD-10-CM

## 2023-01-23 DIAGNOSIS — E039 Hypothyroidism, unspecified: Secondary | ICD-10-CM

## 2023-01-23 MED ORDER — GABAPENTIN 300 MG PO CAPS: 300.0000 mg | ORAL_CAPSULE | Freq: Every day | ORAL | 3 refills | Status: DC

## 2023-01-23 MED ORDER — ALPRAZOLAM 0.5 MG PO TABS: 0.5000 mg | ORAL_TABLET | Freq: Three times a day (TID) | ORAL | 1 refills | Status: DC | PRN

## 2023-01-23 NOTE — Progress Notes (Addendum)
 Subjective:    Patient ID: [name] [name], female    DOB: February 17, 1965, 58 y.o.   MRN: [id number]  HPI Patient is a 58 year old Caucasian female who presents today for follow up of chronic medical problems.  Patient has not had a mammogram in 5 years.  She is overdue for lung cancer screening with a CT scan.  She declines this today.  She simply wants a refill on her Xanax .  She takes 1 Xanax  a day.  She would like to get a 90-day supply which would be 90 pills.  She also wants a refill on her thyroid medication.  She quit smoking 1 year ago.  I congratulated her on this.  Her blood pressure today is excellent at 120/82. Past Medical History:  Diagnosis Date   AAA (abdominal aortic aneurysm)    3.6 cm 04/2016   Anxiety    Cough    Depression    Hypertension    Hypothyroid 11/28/2016   Hypothyroidism    Kidney stone    Nicotine dependence, cigarettes, with other nicotine-induced disorders    Nocturia    OSA on CPAP    AHI 49.3, cpap at 10 cm H2O (07/05/16)   Ovarian cancer (HCC)    Pneumonia    Sleep apnea    Thyroid disease    Tonsillar and adenoid hypertrophy    Past Surgical History:  Procedure Laterality Date   ABDOMINAL HYSTERECTOMY  2003   CHOLECYSTECTOMY     COLONOSCOPY N/A 12/27/2016   Procedure: COLONOSCOPY;  Surgeon: Golda Claudis PENNER, MD;  [facility];  Service: Endoscopy;  Laterality: N/A;  2:15   HEMORRHOID SURGERY  2007   SEPTOPLASTY N/A 08/24/2014   Procedure: SEPTOPLASTY;  Surgeon: Marlyce Finer, MD;  Location: [facility] OR;  Service: ENT;  Laterality: N/A;   TONSILLECTOMY N/A 08/24/2014   Procedure: TONSILLECTOMY;  Surgeon: Marlyce Finer, MD;  [facility];  Service: ENT;  Laterality: N/A;   TUBAL LIGATION     TURBINATE REDUCTION N/A 08/24/2014   Procedure: TURBINATE REDUCTION AND UVULECTOMY;  Surgeon: Marlyce Finer, MD;  Location: [facility] OR;  Service: ENT;  Laterality: N/A;   Current Outpatient Medications on File Prior to Visit  Medication Sig Dispense Refill    ALPRAZolam  (XANAX ) 0.5 MG tablet Take 1 tablet (0.5 mg total) by mouth 3 (three) times daily as needed. for anxiety 60 tablet 1   atorvastatin  (LIPITOR) 20 MG tablet TAKE 1 TABLET BY MOUTH EVERY DAY 90 tablet 3   buPROPion  (WELLBUTRIN  XL) 150 MG 24 hr tablet TAKE 1 TABLET BY MOUTH DAILY 90 tablet 3   DULoxetine  (CYMBALTA ) 60 MG capsule TAKE ONE CAPSULE BY MOUTH EVERY DAY 120 capsule 3   hydrochlorothiazide (HYDRODIURIL) 25 MG tablet TAKE 1 TABLET BY MOUTH EVERY DAY 90 tablet 3   ibuprofen  (ADVIL ,MOTRIN ) 200 MG tablet Take 800 mg by mouth every 8 (eight) hours as needed for headache.     levothyroxine  (SYNTHROID ) 125 MCG tablet TAKE 1 TABLET BY MOUTH DAILY 90 tablet 3   losartan  (COZAAR ) 100 MG tablet TAKE 1 TABLET BY MOUTH EVERY DAY 90 tablet 3   Multiple Vitamin (MULTIVITAMIN WITH MINERALS) TABS tablet Take 1 tablet by mouth daily.     Omega-3 Fatty Acids (FISH OIL ) 1000 MG CAPS Take 2 capsules (2,000 mg total) by mouth in the morning and at bedtime.     pramipexole  (MIRAPEX ) 0.5 MG tablet TAKE 1 TABLET BY MOUTH AT BEDTIME 90 tablet 0   topiramate  (TOPAMAX )  25 MG tablet TAKE TWO TABLETS BY MOUTH TWICE DAILY 120 tablet 1   No current facility-administered medications on file prior to visit.   Allergies  Allergen Reactions   Other Other (See Comments)    IV CONTRAST caused crazy thoughts and speech   Social History   Socioeconomic History   Marital status: Married    Spouse name: Christopher   Number of children: 1   Years of education: 12   Highest education level: Not on file  Occupational History    Employer: [employer]  Tobacco Use   Smoking status: Every Day    Current packs/day: 1.00    Average packs/day: 1 pack/day for 17.0 years (17.0 ttl pk-yrs)    Types: Cigarettes   Smokeless tobacco: Never  Vaping Use   Vaping status: Never Used  Substance and Sexual Activity   Alcohol use: No   Drug use: No   Sexual activity: Not on file  Other Topics Concern   Not on file   Social History Narrative   Patient is married Astrid) and lives at home with her husband.   Patient has one adult child.   Patient is working full-time.   Patient has a high school education.   Patient is left-handed.   Patient drinks a 2 liter per day.   Social Drivers of Corporate Investment Banker Strain: Not on file  Food Insecurity: Not on file  Transportation Needs: Not on file  Physical Activity: Not on file  Stress: Not on file  Social Connections: Not on file  Intimate Partner Violence: Not on file   Family History  Problem Relation Age of Onset   Parkinson's disease Mother    Heart Problems Father    Thyroid disease Maternal Grandmother    Lymphoma Maternal Grandmother    Colon cancer Neg Hx       Review of Systems  All other systems reviewed and are negative.      Objective:   Physical Exam Vitals reviewed.  Constitutional:      General: She is not in acute distress.    Appearance: Normal appearance. She is obese. She is not ill-appearing, toxic-appearing or diaphoretic.  HENT:     Head: Normocephalic and atraumatic.  Neck:     Vascular: No carotid bruit.  Cardiovascular:     Rate and Rhythm: Normal rate and regular rhythm.     Pulses: Normal pulses.     Heart sounds: Murmur heard.     No friction rub. No gallop.  Pulmonary:     Effort: Pulmonary effort is normal. No respiratory distress.     Breath sounds: Normal breath sounds. No stridor. No wheezing, rhonchi or rales.  Chest:     Chest wall: No tenderness.  Musculoskeletal:     Cervical back: Neck supple.     Right lower leg: No edema.     Left lower leg: No edema.  Lymphadenopathy:     Cervical: No cervical adenopathy.  Skin:    General: Skin is warm.     Coloration: Skin is not jaundiced or pale.     Findings: No bruising, erythema, lesion or rash.  Neurological:     General: No focal deficit present.     Mental Status: She is alert and oriented to person, place, and time.      Cranial Nerves: No cranial nerve deficit.     Sensory: No sensory deficit.     Motor: No weakness.     Coordination:  Coordination normal.     Gait: Gait normal.     Deep Tendon Reflexes: Reflexes normal.  Psychiatric:        Attention and Perception: Attention normal.        Speech: Speech normal.        Behavior: Behavior normal.        Cognition and Memory: Cognition and memory normal.           Assessment & Plan:  Hypothyroidism, unspecified type - Plan: TSH  Benign essential HTN  Pure hypercholesterolemia - Plan: CBC with Differential/Platelet, COMPLETE METABOLIC PANEL WITH GFR, Lipid panel, TSH  Smoker  Murmur - Plan: ECHOCARDIOGRAM COMPLETE Patient has a 3 out of 6 systolic ejection murmur.  Based on location of the murmur on chest wall and sound, I suspect aortic stenosis.  Recommended an echocardiogram to evaluate further.  Given her previous smoking history, I recommended a CT scan of the lungs to evaluate for lung cancer.  She declines this.  I recommended a mammogram.  She declines this.  Her blood pressure today is excellent.  I will check a fasting lipid panel to monitor her cholesterol.  Goal ldl is <100.  Check tsh to ensure proper dose of levothyroxine .   Gave her 90 xanax  to last 90 days

## 2023-01-24 LAB — CBC WITH DIFFERENTIAL/PLATELET
Absolute Lymphocytes: 2727 {cells}/uL (ref 850–3900)
Absolute Monocytes: 367 {cells}/uL (ref 200–950)
Basophils Absolute: 38 {cells}/uL (ref 0–200)
Basophils Relative: 0.7 %
Eosinophils Absolute: 81 {cells}/uL (ref 15–500)
Eosinophils Relative: 1.5 %
HCT: 39.6 % (ref 35.0–45.0)
Hemoglobin: 13.4 g/dL (ref 11.7–15.5)
MCH: 32.5 pg (ref 27.0–33.0)
MCHC: 33.8 g/dL (ref 32.0–36.0)
MCV: 96.1 fL (ref 80.0–100.0)
MPV: 9.2 fL (ref 7.5–12.5)
Monocytes Relative: 6.8 %
Neutro Abs: 2187 {cells}/uL (ref 1500–7800)
Neutrophils Relative %: 40.5 %
Platelets: 300 10*3/uL (ref 140–400)
RBC: 4.12 10*6/uL (ref 3.80–5.10)
RDW: 14 % (ref 11.0–15.0)
Total Lymphocyte: 50.5 %
WBC: 5.4 10*3/uL (ref 3.8–10.8)

## 2023-01-24 LAB — COMPLETE METABOLIC PANEL WITH GFR
AG Ratio: 1.4 (calc) (ref 1.0–2.5)
ALT: 60 U/L — ABNORMAL HIGH (ref 6–29)
AST: 48 U/L — ABNORMAL HIGH (ref 10–35)
Albumin: 4.3 g/dL (ref 3.6–5.1)
Alkaline phosphatase (APISO): 71 U/L (ref 37–153)
BUN: 17 mg/dL (ref 7–25)
CO2: 25 mmol/L (ref 20–32)
Calcium: 10 mg/dL (ref 8.6–10.4)
Chloride: 104 mmol/L (ref 98–110)
Creat: 0.87 mg/dL (ref 0.50–1.03)
Globulin: 3.1 g/dL (ref 1.9–3.7)
Glucose, Bld: 119 mg/dL — ABNORMAL HIGH (ref 65–99)
Potassium: 4 mmol/L (ref 3.5–5.3)
Sodium: 142 mmol/L (ref 135–146)
Total Bilirubin: 0.4 mg/dL (ref 0.2–1.2)
Total Protein: 7.4 g/dL (ref 6.1–8.1)
eGFR: 79 mL/min/{1.73_m2} (ref >=60)

## 2023-01-24 LAB — LIPID PANEL
Cholesterol: 157 mg/dL (ref <200)
HDL: 36 mg/dL — ABNORMAL LOW (ref >=50)
LDL Cholesterol (Calc): 86 mg/dL
Non-HDL Cholesterol (Calc): 121 mg/dL (ref <130)
Total CHOL/HDL Ratio: 4.4 (calc) (ref <5.0)
Triglycerides: 268 mg/dL — ABNORMAL HIGH (ref <150)

## 2023-01-24 LAB — TSH: TSH: 10.6 m[IU]/L — ABNORMAL HIGH

## 2023-01-29 ENCOUNTER — Other Ambulatory Visit: Payer: Self-pay

## 2023-01-29 DIAGNOSIS — E039 Hypothyroidism, unspecified: Secondary | ICD-10-CM

## 2023-01-29 DIAGNOSIS — R7989 Other specified abnormal findings of blood chemistry: Secondary | ICD-10-CM

## 2023-01-29 MED ORDER — LEVOTHYROXINE SODIUM 150 MCG PO TABS: 150.0000 ug | ORAL_TABLET | Freq: Every day | ORAL | 3 refills | Status: AC

## 2023-01-29 NOTE — Telephone Encounter (Signed)
 ERROR

## 2023-02-04 ENCOUNTER — Other Ambulatory Visit (HOSPITAL_COMMUNITY): Payer: Self-pay | Admitting: Family Medicine

## 2023-02-04 DIAGNOSIS — Z1231 Encounter for screening mammogram for malignant neoplasm of breast: Secondary | ICD-10-CM

## 2023-02-07 ENCOUNTER — Other Ambulatory Visit: Payer: Self-pay | Admitting: Family Medicine

## 2023-02-07 DIAGNOSIS — I350 Nonrheumatic aortic (valve) stenosis: Secondary | ICD-10-CM

## 2023-02-12 ENCOUNTER — Ambulatory Visit (HOSPITAL_COMMUNITY)
Admission: RE | Admit: 2023-02-12 | Discharge: 2023-02-12 | Disposition: A | Discharge status: Home / Self Care | Payer: 59 | Source: Ambulatory Visit | Attending: Family Medicine | Admitting: Family Medicine

## 2023-02-12 ENCOUNTER — Inpatient Hospital Stay (HOSPITAL_COMMUNITY): Admission: RE | Admit: 2023-02-12 | Payer: 59 | Source: Ambulatory Visit

## 2023-02-12 DIAGNOSIS — Z9049 Acquired absence of other specified parts of digestive tract: Secondary | ICD-10-CM | POA: Diagnosis not present

## 2023-02-12 DIAGNOSIS — Z1231 Encounter for screening mammogram for malignant neoplasm of breast: Secondary | ICD-10-CM | POA: Insufficient documentation

## 2023-02-12 DIAGNOSIS — R7989 Other specified abnormal findings of blood chemistry: Secondary | ICD-10-CM | POA: Diagnosis not present

## 2023-02-18 ENCOUNTER — Telehealth: Payer: Self-pay

## 2023-02-18 NOTE — Telephone Encounter (Signed)
 Copied from CRM [id number]. Topic: Referral - Status >> Feb 18, 2023 10:53 AM Graeme ORN wrote: Reason for CRM: [name] [name] called from [facility] regarding referral received for patient echo. Reached out to patient she is not able to afford at the time and requested it be canceled. Callback: [phone] Thank You

## 2023-02-18 NOTE — Telephone Encounter (Signed)
Please notify [name] [name] that her insurance has refused echo.  Would she like to see cardiology to further evaluate her murmur.

## 2023-02-28 ENCOUNTER — Other Ambulatory Visit: Payer: Self-pay | Admitting: Family Medicine

## 2023-04-27 ENCOUNTER — Other Ambulatory Visit: Payer: Self-pay | Admitting: Family Medicine

## 2023-04-28 NOTE — Telephone Encounter (Signed)
 Requested medications are due for refill today.  yes  Requested medications are on the active medications list.  yess  Last refill. 02/28/2023 #60 1 rf  Future visit scheduled.   no  Notes to clinic.  Refill not delegated.    Requested Prescriptions  Pending Prescriptions Disp Refills   ALPRAZolam (XANAX) 0.5 MG tablet [Pharmacy Med Name: alprazolam 0.5 mg tablet] 60 tablet 1    Sig: TAKE 1 TABLET BY MOUTH THREE TIMES DAILY AS NEEDED FOR ANXIETY     Not Delegated - Psychiatry: Anxiolytics/Hypnotics 2 Failed - 04/28/2023  5:35 PM      Failed - This refill cannot be delegated      Failed - Urine Drug Screen completed in last 360 days      Failed - Valid encounter within last 6 months    Recent Outpatient Visits           3 months ago Hypothyroidism, unspecified type   [facility] Medicine Austine Lefort, MD   1 year ago Hypothyroidism, unspecified type   [facility] Medicine Pickard, Cisco Crest, MD              Passed - Patient is not pregnant

## 2023-05-01 ENCOUNTER — Encounter (HOSPITAL_COMMUNITY): Payer: Self-pay

## 2023-05-02 ENCOUNTER — Telehealth: Payer: Self-pay | Admitting: Internal Medicine

## 2023-05-02 NOTE — Telephone Encounter (Signed)
 Contacted by [facility] physician regarding Ms. [name] [name].  58 year old female with a significant past medical history of reported abdominal aorta aneurysm, hypertension, hypothyroidism, hypercholesterolemia, and cigarette use presented with bilateral breast pain.  ECG unremarkable with initial HS-troponin of 1,504.  Requested patient to be sent to the hospitalist service with cardiology for consultation.  Patient will be started on heparin drip and given aspirin.

## 2023-05-07 ENCOUNTER — Other Ambulatory Visit: Payer: Self-pay | Admitting: Family Medicine

## 2023-05-07 NOTE — Telephone Encounter (Signed)
 Requested Prescriptions  Pending Prescriptions Disp Refills   gabapentin  (NEURONTIN ) 300 MG capsule [Pharmacy Med Name: gabapentin  300 mg capsule] 30 capsule 3    Sig: TAKE ONE CAPSULE BY MOUTH AT BEDTIME     Neurology: Anticonvulsants - gabapentin  Failed - 05/07/2023  5:54 PM      Failed - Completed PHQ-2 or PHQ-9 in the last 360 days      Failed - Valid encounter within last 12 months    Recent Outpatient Visits           3 months ago Hypothyroidism, unspecified type   [facility] Medicine Austine Lefort, MD   1 year ago Hypothyroidism, unspecified type   [facility] Medicine Austine Lefort, MD              Passed - Cr in normal range and within 360 days    Creat  Date Value Ref Range Status  01/23/2023 0.87 0.50 - 1.03 mg/dL Final

## 2023-05-13 ENCOUNTER — Other Ambulatory Visit: Payer: Self-pay | Admitting: Family Medicine

## 2023-05-15 NOTE — Telephone Encounter (Signed)
 Please approve medications for refill is appropriate. Thank you

## 2023-06-29 ENCOUNTER — Other Ambulatory Visit: Payer: Self-pay | Admitting: Family Medicine

## 2023-08-29 ENCOUNTER — Other Ambulatory Visit: Payer: Self-pay | Admitting: Family Medicine

## 2023-09-01 NOTE — Telephone Encounter (Signed)
 Requested medications are due for refill today.  yes  Requested medications are on the active medications list.  yes  Last refill. 06/30/2023 #60 1 rf  Future visit scheduled.   no  Notes to clinic.  Refill not delegated.    Requested Prescriptions  Pending Prescriptions Disp Refills   ALPRAZolam  (XANAX ) 0.5 MG tablet [Pharmacy Med Name: alprazolam  0.5 mg tablet] 60 tablet 1    Sig: TAKE 1 TABLET BY MOUTH THREE TIMES DAILY AS NEEDED FOR ANXIETY     Not Delegated - Psychiatry: Anxiolytics/Hypnotics 2 Failed - 09/01/2023  4:56 PM      Failed - This refill cannot be delegated      Failed - Urine Drug Screen completed in last 360 days      Failed - Valid encounter within last 6 months    Recent Outpatient Visits           7 months ago Hypothyroidism, unspecified type   [facility] Duanne Butler DASEN, MD   1 year ago Hypothyroidism, unspecified type   [place] [facility] Medicine Pickard, Butler DASEN, MD              Passed - Patient is not pregnant

## 2023-09-08 ENCOUNTER — Other Ambulatory Visit: Payer: Self-pay | Admitting: Family Medicine

## 2023-09-09 ENCOUNTER — Other Ambulatory Visit: Payer: Self-pay | Admitting: Family Medicine

## 2023-09-09 NOTE — Telephone Encounter (Signed)
 Requested medication (s) are due for refill today:   Yes  Requested medication (s) are on the active medication list:   Yes  Future visit scheduled:   No.   LOV 01/23/2023    Last ordered: 05/07/2023 #30, 3 refills  Unable to refill because PHQ-2 or 9 due per protocol    Requested Prescriptions  Pending Prescriptions Disp Refills   gabapentin  (NEURONTIN ) 300 MG capsule [Pharmacy Med Name: gabapentin  300 mg capsule] 30 capsule 3    Sig: TAKE ONE CAPSULE BY MOUTH AT BEDTIME     Neurology: Anticonvulsants - gabapentin  Failed - 09/09/2023  1:15 PM      Failed - Completed PHQ-2 or PHQ-9 in the last 360 days      Failed - Valid encounter within last 12 months    Recent Outpatient Visits           7 months ago Hypothyroidism, unspecified type   [facility] Medicine Duanne Butler DASEN, MD   1 year ago Hypothyroidism, unspecified type   [facility] Medicine Pickard, Butler DASEN, MD              Passed - Cr in normal range and within 360 days    Creat  Date Value Ref Range Status  01/23/2023 0.87 0.50 - 1.03 mg/dL Final

## 2023-09-10 NOTE — Telephone Encounter (Signed)
 LOV 01/23/23.  Requested Prescriptions  Pending Prescriptions Disp Refills   pramipexole  (MIRAPEX ) 0.5 MG tablet [Pharmacy Med Name: pramipexole  0.5 mg tablet] 90 tablet 0    Sig: TAKE 1 TABLET BY MOUTH AT BEDTIME (patient needs appointment)     Neurology:  Parkinsonian Agents Failed - 09/10/2023 12:53 PM      Failed - Valid encounter within last 12 months    Recent Outpatient Visits           7 months ago Hypothyroidism, unspecified type   [place] Medicine Duanne Butler DASEN, MD   1 year ago Hypothyroidism, unspecified type   [facility] Medicine Pickard, Butler DASEN, MD              Passed - Last BP in normal range    BP Readings from Last 1 Encounters:  01/23/23 120/82         Passed - Last Heart Rate in normal range    Pulse Readings from Last 1 Encounters:  01/23/23 97

## 2023-12-15 ENCOUNTER — Other Ambulatory Visit: Payer: Self-pay | Admitting: Family Medicine

## 2023-12-25 ENCOUNTER — Other Ambulatory Visit: Payer: Self-pay | Admitting: Family Medicine

## 2024-01-16 ENCOUNTER — Other Ambulatory Visit: Payer: Self-pay | Admitting: Family Medicine

## 2024-02-05 ENCOUNTER — Other Ambulatory Visit: Payer: Self-pay | Admitting: Family Medicine
